# Patient Record
Sex: Male | Born: 1985 | Race: Black or African American | Hispanic: No | Marital: Single | Smoking: Former smoker
Health system: Southern US, Community
[De-identification: ages and names within clinical notes are randomized; demographics above are authoritative.]

## PROBLEM LIST (undated history)

## (undated) DIAGNOSIS — J45909 Unspecified asthma, uncomplicated: Secondary | ICD-10-CM

## (undated) DIAGNOSIS — S61219A Laceration without foreign body of unspecified finger without damage to nail, initial encounter: Secondary | ICD-10-CM

## (undated) DIAGNOSIS — K30 Functional dyspepsia: Secondary | ICD-10-CM

## (undated) DIAGNOSIS — Z87898 Personal history of other specified conditions: Secondary | ICD-10-CM

## (undated) HISTORY — PX: NO PAST SURGERIES: SHX2092

---
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
## 2014-06-20 ENCOUNTER — Emergency Department (HOSPITAL_COMMUNITY)
Admission: EM | Admit: 2014-06-20 | Discharge: 2014-06-21 | Disposition: A | Discharge status: Home / Self Care | Payer: Self-pay | Attending: Emergency Medicine | Admitting: Emergency Medicine

## 2014-06-20 ENCOUNTER — Encounter (HOSPITAL_COMMUNITY): Payer: Self-pay | Admitting: Emergency Medicine

## 2014-06-20 DIAGNOSIS — Y9389 Activity, other specified: Secondary | ICD-10-CM | POA: Insufficient documentation

## 2014-06-20 DIAGNOSIS — Y9289 Other specified places as the place of occurrence of the external cause: Secondary | ICD-10-CM | POA: Insufficient documentation

## 2014-06-20 DIAGNOSIS — Z880 Allergy status to penicillin: Secondary | ICD-10-CM | POA: Insufficient documentation

## 2014-06-20 DIAGNOSIS — S61209A Unspecified open wound of unspecified finger without damage to nail, initial encounter: Secondary | ICD-10-CM | POA: Insufficient documentation

## 2014-06-20 DIAGNOSIS — S61219A Laceration without foreign body of unspecified finger without damage to nail, initial encounter: Secondary | ICD-10-CM

## 2014-06-20 DIAGNOSIS — W268XXA Contact with other sharp object(s), not elsewhere classified, initial encounter: Secondary | ICD-10-CM | POA: Insufficient documentation

## 2014-06-20 DIAGNOSIS — F17200 Nicotine dependence, unspecified, uncomplicated: Secondary | ICD-10-CM | POA: Insufficient documentation

## 2014-06-20 DIAGNOSIS — S61210A Laceration without foreign body of right index finger without damage to nail, initial encounter: Secondary | ICD-10-CM

## 2014-06-20 DIAGNOSIS — J45909 Unspecified asthma, uncomplicated: Secondary | ICD-10-CM | POA: Insufficient documentation

## 2014-06-20 HISTORY — DX: Unspecified asthma, uncomplicated: J45.909

## 2014-06-20 HISTORY — DX: Laceration without foreign body of unspecified finger without damage to nail, initial encounter: S61.219A

## 2014-06-20 MED ORDER — LIDOCAINE HCL 2 % IJ SOLN: 10.0000 mL | Freq: Once | INTRAMUSCULAR | Status: DC

## 2014-06-20 NOTE — ED Notes (Signed)
Pt. presents with laceration at right mid index finger sustained this evening while working at a dry wall using a knife , minimal bleeding , dressing applied at triage .

## 2014-06-21 ENCOUNTER — Other Ambulatory Visit: Payer: Self-pay | Admitting: Orthopedic Surgery

## 2014-06-21 ENCOUNTER — Encounter (HOSPITAL_BASED_OUTPATIENT_CLINIC_OR_DEPARTMENT_OTHER): Payer: Self-pay | Admitting: *Deleted

## 2014-06-21 MED ORDER — HYDROCODONE-ACETAMINOPHEN 5-325 MG PO TABS
2.0000 | ORAL_TABLET | Freq: Once | ORAL | Status: AC
  Administered 2014-06-21: 2 via ORAL
  Filled 2014-06-21: qty 2

## 2014-06-21 MED ORDER — SULFAMETHOXAZOLE-TMP DS 800-160 MG PO TABS
1.0000 | ORAL_TABLET | Freq: Once | ORAL | Status: AC
  Administered 2014-06-21: 1 via ORAL
  Filled 2014-06-21: qty 1

## 2014-06-21 MED ORDER — HYDROCODONE-ACETAMINOPHEN 5-325 MG PO TABS: ORAL_TABLET | ORAL | Status: DC

## 2014-06-21 MED ORDER — SULFAMETHOXAZOLE-TRIMETHOPRIM 800-160 MG PO TABS: 1.0000 | ORAL_TABLET | Freq: Two times a day (BID) | ORAL | Status: DC

## 2014-06-21 NOTE — Discharge Instructions (Signed)
Rest, Ice intermittently (in the first 24-48 hours), Gentle compression with an Ace wrap, and elevate (Limb above the level of the heart)   Take up to  of ibuprofen (that is usually 4 over the counter pills)  3 times a day for 5 days. Take with food.  Take vicodin for breakthrough pain, do not drink alcohol, drive, care for children or do other critical tasks while taking vicodin.  Do not hesitate to return to the emergency room for any new, worsening or concerning symptoms.  Please obtain primary care using resource guide below. But the minute you were seen in the emergency room and that they will need to obtain records for further outpatient management.
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
## 2014-06-21 NOTE — ED Provider Notes (Signed)
CSN: [id number]     Arrival date & time 06/20/14  2318 History   First MD Initiated Contact with Patient 06/20/14 2335     Chief Complaint  Patient presents with  . Laceration     (Consider location/radiation/quality/duration/timing/severity/associated sxs/prior Treatment) HPI  Gilbert Morgan is a 28 y.o. male complaining of presenting for evaluation of laceration to right third digit sustained just prior to arrival. Patient states last tetanus was in 2011. Patient cut the right hand while using a box cutter while working on drywall in his house. Pain is severe and bleeding is not controlled. Patient states he has decreased sensation to the tip of the finger. Patient is right-hand-dominant.  Past Medical History  Diagnosis Date  . Asthma    History reviewed. No pertinent past surgical history. No family history on file. History  Substance Use Topics  . Smoking status: Current Every Day Smoker  . Smokeless tobacco: Not on file  . Alcohol Use: Yes    Review of Systems  10 systems reviewed and found to be negative, except as noted in the HPI.   Allergies  Penicillins  Home Medications   Prior to Admission medications   Not on File   BP 135/78  Pulse 58  Temp(Src) 97.8 F (36.6 C) (Oral)  Resp 14  Ht  (1.778 m)  Wt 203 lb (92.08 kg)  BMI 29.13 kg/m2  SpO2 94% Physical Exam  Nursing note and vitals reviewed. Constitutional: He is oriented to person, place, and time. He appears well-developed and well-nourished. No distress.  HENT:  Head: Normocephalic and atraumatic.  Mouth/Throat: Oropharynx is clear and moist.  Eyes: Conjunctivae and EOM are normal. Pupils are equal, round, and reactive to light.  Neck: Normal range of motion. Neck supple.  Cardiovascular: Normal rate, regular rhythm and intact distal pulses.   Pulmonary/Chest: Effort normal and breath sounds normal. No stridor. No respiratory distress. He has no wheezes. He has no rales. He exhibits no  tenderness.  Abdominal: Soft. Bowel sounds are normal. He exhibits no distension and no mass. There is no tenderness. There is no rebound and no guarding.  Genitourinary: Rectum normal.  Musculoskeletal: Normal range of motion. He exhibits no edema and no tenderness.       Hands: Full-thickness laceration approximately 2 cm to right second digit. Patient has decreased sensation and no flexor motion to the DIP or PIP.   Patient also has a partial-thickness laceration to the third digit at the PIP.   Neurological: He is alert and oriented to person, place, and time. No cranial nerve deficit. Coordination normal.  Psychiatric: He has a normal mood and affect.    ED Course  LACERATION REPAIR Date/Time: 06/21/2014 1:53 AM Performed by: Wynetta Emery Authorized by: Wynetta Emery Consent: Verbal consent obtained. Risks and benefits: risks, benefits and alternatives were discussed Consent given by: patient Required items: required blood products, implants, devices, and special equipment available Patient identity confirmed: verbally with patient Body area: upper extremity Location details: right index finger Laceration length: 2 cm Foreign bodies: no foreign bodies Tendon involvement: complex Nerve involvement: complex Vascular damage: no Anesthesia: digital block Local anesthetic: lidocaine 2% without epinephrine Anesthetic total: 5 ml Patient sedated: no Preparation: Patient was prepped and draped in the usual sterile fashion. Irrigation solution: saline Irrigation method: syringe Amount of cleaning: extensive Debridement: minimal Degree of undermining: none Skin closure: 4-0 Prolene Number of sutures: 6 Technique: running Approximation: loose Approximation difficulty: simple Dressing: antibiotic ointment and 4x4  sterile gauze Patient tolerance: Patient tolerated the procedure well with no immediate complications.  SPLINT APPLICATION Date/Time: 2:01 AM Authorized by:  Wynetta Emery Consent: Verbal consent obtained. Risks and benefits: risks, benefits and alternatives were discussed Consent given by: patient Splint applied by:  Mikyah Alamo, PA-C Location details: right second digit Splint type: static fingersplint Supplies used: Metal moldable finger splint Post-procedure: The splinted body part was neurovascularly unchanged following the procedure. Patient tolerance: Patient tolerated the procedure well with no immediate complications.    Labs Review Labs Reviewed - No data to display  Imaging Review No results found.   EKG Interpretation None      MDM   Final diagnoses:  Laceration of second finger, right, with tendon, initial encounter    Filed Vitals:   06/20/14 2330  BP: 135/78  Pulse: 58  Temp: 97.8 F (36.6 C)  TempSrc: Oral  Resp: 14  Height:  (1.778 m)  Weight: 203 lb (92.08 kg)  SpO2: 94%    Medications  lidocaine (XYLOCAINE) 2 % (with pres) injection 200 mg (not administered)  sulfamethoxazole-trimethoprim (BACTRIM DS) 800-160 MG per tablet 1 tablet (not administered)    Gilbert Morgan is a 28 y.o. male presenting with right hand second digit laceration with complete tendon transection. Wound is closed and patient is started on Bactrim (patient has anaphylactic reaction to penicillin) in consult from Dr. Donnelly Stager appreciated: He agrees with care plan and will see the patient in his office tomorrow. Note that patient has declined x-ray, advised him of the risks and he is exertional and stability.  This is a shared visit with the attending physician who personally evaluated the patient and agrees with the care plan.   Evaluation does not show pathology that would require ongoing emergent intervention or inpatient treatment. Pt is hemodynamically stable and mentating appropriately. Discussed findings and plan with patient/guardian, who agrees with care plan. All questions answered. Return precautions  discussed and outpatient follow up given.   New Prescriptions   HYDROCODONE-ACETAMINOPHEN (NORCO/VICODIN) 5-325 MG PER TABLET    Take 1-2 tablets by mouth every 6 hours as needed for pain.   SULFAMETHOXAZOLE-TRIMETHOPRIM (SEPTRA DS) 800-160 MG PER TABLET    Take 1 tablet by mouth every 12 (twelve) hours.         Wynetta Emery, PA-C 06/21/14 [phone]

## 2014-06-27 ENCOUNTER — Encounter (HOSPITAL_BASED_OUTPATIENT_CLINIC_OR_DEPARTMENT_OTHER): Payer: Self-pay | Admitting: Anesthesiology

## 2014-06-27 ENCOUNTER — Ambulatory Visit (HOSPITAL_BASED_OUTPATIENT_CLINIC_OR_DEPARTMENT_OTHER)
Admission: RE | Admit: 2014-06-27 | Discharge: 2014-06-27 | Disposition: A | Discharge status: Home / Self Care | Payer: Self-pay | Source: Ambulatory Visit | Attending: Orthopedic Surgery | Admitting: Orthopedic Surgery

## 2014-06-27 ENCOUNTER — Encounter (HOSPITAL_BASED_OUTPATIENT_CLINIC_OR_DEPARTMENT_OTHER)
Admission: RE | Disposition: A | Discharge status: Home / Self Care | Payer: Self-pay | Source: Ambulatory Visit | Attending: Orthopedic Surgery

## 2014-06-27 ENCOUNTER — Ambulatory Visit (HOSPITAL_BASED_OUTPATIENT_CLINIC_OR_DEPARTMENT_OTHER): Payer: Self-pay | Admitting: Anesthesiology

## 2014-06-27 DIAGNOSIS — S61209A Unspecified open wound of unspecified finger without damage to nail, initial encounter: Secondary | ICD-10-CM | POA: Insufficient documentation

## 2014-06-27 DIAGNOSIS — F17200 Nicotine dependence, unspecified, uncomplicated: Secondary | ICD-10-CM | POA: Insufficient documentation

## 2014-06-27 DIAGNOSIS — W269XXA Contact with unspecified sharp object(s), initial encounter: Secondary | ICD-10-CM | POA: Insufficient documentation

## 2014-06-27 DIAGNOSIS — Z880 Allergy status to penicillin: Secondary | ICD-10-CM | POA: Insufficient documentation

## 2014-06-27 DIAGNOSIS — S56129A Laceration of flexor muscle, fascia and tendon of unspecified finger at forearm level, initial encounter: Secondary | ICD-10-CM

## 2014-06-27 DIAGNOSIS — F1210 Cannabis abuse, uncomplicated: Secondary | ICD-10-CM | POA: Insufficient documentation

## 2014-06-27 DIAGNOSIS — J45909 Unspecified asthma, uncomplicated: Secondary | ICD-10-CM | POA: Insufficient documentation

## 2014-06-27 DIAGNOSIS — IMO0002 Reserved for concepts with insufficient information to code with codable children: Secondary | ICD-10-CM | POA: Insufficient documentation

## 2014-06-27 DIAGNOSIS — Y929 Unspecified place or not applicable: Secondary | ICD-10-CM | POA: Insufficient documentation

## 2014-06-27 HISTORY — DX: Functional dyspepsia: K30

## 2014-06-27 HISTORY — DX: Personal history of other specified conditions: Z87.898

## 2014-06-27 HISTORY — PX: NERVE, TENDON AND ARTERY REPAIR: SHX5695

## 2014-06-27 HISTORY — DX: Laceration without foreign body of unspecified finger without damage to nail, initial encounter: S61.219A

## 2014-06-27 LAB — POCT HEMOGLOBIN-HEMACUE: Hemoglobin: 10.9 g/dL — ABNORMAL LOW (ref 13.0–17.0)

## 2014-06-27 SURGERY — NERVE, TENDON AND ARTERY REPAIR
Anesthesia: General | Site: Finger | Laterality: Right

## 2014-06-27 MED ORDER — LACTATED RINGERS IV SOLN
INTRAVENOUS | Status: DC
  Administered 2014-06-27 (×2): via INTRAVENOUS

## 2014-06-27 MED ORDER — OXYCODONE-ACETAMINOPHEN 5-325 MG PO TABS: 1.0000 | ORAL_TABLET | ORAL | Status: DC | PRN

## 2014-06-27 MED ORDER — FENTANYL CITRATE 0.05 MG/ML IJ SOLN: 50.0000 ug | INTRAMUSCULAR | Status: DC | PRN

## 2014-06-27 MED ORDER — PROMETHAZINE HCL 25 MG/ML IJ SOLN: 6.2500 mg | INTRAMUSCULAR | Status: DC | PRN

## 2014-06-27 MED ORDER — DEXAMETHASONE SODIUM PHOSPHATE 10 MG/ML IJ SOLN
INTRAMUSCULAR | Status: DC | PRN
  Administered 2014-06-27: 10 mg via INTRAVENOUS

## 2014-06-27 MED ORDER — PROPOFOL 10 MG/ML IV BOLUS
INTRAVENOUS | Status: DC | PRN
  Administered 2014-06-27: 200 mg via INTRAVENOUS

## 2014-06-27 MED ORDER — BUPIVACAINE HCL (PF) 0.25 % IJ SOLN
INTRAMUSCULAR | Status: DC | PRN
  Administered 2014-06-27: 10 mL

## 2014-06-27 MED ORDER — HYDROMORPHONE HCL 1 MG/ML IJ SOLN
INTRAMUSCULAR | Status: AC
  Filled 2014-06-27: qty 1

## 2014-06-27 MED ORDER — VANCOMYCIN HCL IN DEXTROSE 1-5 GM/200ML-% IV SOLN
INTRAVENOUS | Status: AC
  Filled 2014-06-27: qty 200

## 2014-06-27 MED ORDER — BUPIVACAINE HCL (PF) 0.25 % IJ SOLN
INTRAMUSCULAR | Status: AC
  Filled 2014-06-27: qty 30

## 2014-06-27 MED ORDER — VANCOMYCIN HCL IN DEXTROSE 1-5 GM/200ML-% IV SOLN
1000.0000 mg | INTRAVENOUS | Status: AC
  Administered 2014-06-27: 1000 mg via INTRAVENOUS

## 2014-06-27 MED ORDER — HYDROMORPHONE HCL 1 MG/ML IJ SOLN
0.2500 mg | INTRAMUSCULAR | Status: DC | PRN
  Administered 2014-06-27 (×2): 0.5 mg via INTRAVENOUS

## 2014-06-27 MED ORDER — MIDAZOLAM HCL 2 MG/2ML IJ SOLN
INTRAMUSCULAR | Status: AC
  Filled 2014-06-27: qty 2

## 2014-06-27 MED ORDER — LIDOCAINE HCL (PF) 1 % IJ SOLN
INTRAMUSCULAR | Status: AC
  Filled 2014-06-27: qty 30

## 2014-06-27 MED ORDER — LIDOCAINE HCL (CARDIAC) 20 MG/ML IV SOLN
INTRAVENOUS | Status: DC | PRN
  Administered 2014-06-27: 50 mg via INTRAVENOUS

## 2014-06-27 MED ORDER — FENTANYL CITRATE 0.05 MG/ML IJ SOLN
INTRAMUSCULAR | Status: AC
  Filled 2014-06-27: qty 6

## 2014-06-27 MED ORDER — OXYCODONE HCL 5 MG PO TABS: 5.0000 mg | ORAL_TABLET | Freq: Once | ORAL | Status: DC | PRN

## 2014-06-27 MED ORDER — MIDAZOLAM HCL 5 MG/5ML IJ SOLN
INTRAMUSCULAR | Status: DC | PRN
  Administered 2014-06-27: 2 mg via INTRAVENOUS

## 2014-06-27 MED ORDER — HEPARIN SODIUM (PORCINE) 1000 UNIT/ML IJ SOLN
INTRAMUSCULAR | Status: AC
  Filled 2014-06-27: qty 1

## 2014-06-27 MED ORDER — CHLORHEXIDINE GLUCONATE 4 % EX LIQD: 60.0000 mL | Freq: Once | CUTANEOUS | Status: DC

## 2014-06-27 MED ORDER — FENTANYL CITRATE 0.05 MG/ML IJ SOLN
INTRAMUSCULAR | Status: DC | PRN
  Administered 2014-06-27: 100 ug via INTRAVENOUS
  Administered 2014-06-27 (×3): 50 ug via INTRAVENOUS

## 2014-06-27 MED ORDER — MIDAZOLAM HCL 2 MG/2ML IJ SOLN: 1.0000 mg | INTRAMUSCULAR | Status: DC | PRN

## 2014-06-27 MED ORDER — MIDAZOLAM HCL 2 MG/ML PO SYRP
12.0000 mg | ORAL_SOLUTION | Freq: Once | ORAL | Status: DC | PRN
Start: 2014-06-27 — End: 2014-06-27

## 2014-06-27 MED ORDER — ONDANSETRON HCL 4 MG/2ML IJ SOLN
INTRAMUSCULAR | Status: DC | PRN
  Administered 2014-06-27: 4 mg via INTRAVENOUS

## 2014-06-27 MED ORDER — OXYCODONE HCL 5 MG/5ML PO SOLN: 5.0000 mg | Freq: Once | ORAL | Status: DC | PRN

## 2014-06-27 SURGICAL SUPPLY — 67 items
APL SKNCLS STERI-STRIP NONHPOA (GAUZE/BANDAGES/DRESSINGS)
BAG DECANTER FOR FLEXI CONT (MISCELLANEOUS) IMPLANT
BANDAGE ELASTIC 4 VELCRO ST LF (GAUZE/BANDAGES/DRESSINGS) IMPLANT
BENZOIN TINCTURE PRP APPL 2/3 (GAUZE/BANDAGES/DRESSINGS) IMPLANT
BLADE MINI RND TIP GREEN BEAV (BLADE) IMPLANT
BLADE SURG 15 STRL LF DISP TIS (BLADE) ×1 IMPLANT
BLADE SURG 15 STRL SS (BLADE) ×3
BNDG CMPR 9X4 STRL LF SNTH (GAUZE/BANDAGES/DRESSINGS) ×1
BNDG CMPR MD 5X2 ELC HKLP STRL (GAUZE/BANDAGES/DRESSINGS) ×1
BNDG COHESIVE 1X5 TAN STRL LF (GAUZE/BANDAGES/DRESSINGS) IMPLANT
BNDG ELASTIC 2 VLCR STRL LF (GAUZE/BANDAGES/DRESSINGS) ×3 IMPLANT
BNDG ESMARK 4X9 LF (GAUZE/BANDAGES/DRESSINGS) ×2 IMPLANT
BNDG GAUZE ELAST 4 BULKY (GAUZE/BANDAGES/DRESSINGS) ×3 IMPLANT
CLOSURE WOUND 1/2 X4 (GAUZE/BANDAGES/DRESSINGS)
CORDS BIPOLAR (ELECTRODE) ×3 IMPLANT
COVER TABLE BACK 60X90 (DRAPES) ×3 IMPLANT
CUFF TOURNIQUET SINGLE 18IN (TOURNIQUET CUFF) ×3 IMPLANT
DECANTER SPIKE VIAL GLASS SM (MISCELLANEOUS) IMPLANT
DRAPE EXTREMITY TIBURON (DRAPES) ×3 IMPLANT
DRAPE SURG 17X23 STRL (DRAPES) ×3 IMPLANT
DURAPREP 26ML APPLICATOR (WOUND CARE) ×3 IMPLANT
GAUZE SPONGE 4X4 12PLY STRL (GAUZE/BANDAGES/DRESSINGS) ×3 IMPLANT
GAUZE SPONGE 4X4 16PLY XRAY LF (GAUZE/BANDAGES/DRESSINGS) IMPLANT
GAUZE XEROFORM 1X8 LF (GAUZE/BANDAGES/DRESSINGS) ×2 IMPLANT
GLOVE BIOGEL M STRL SZ7.5 (GLOVE) ×4 IMPLANT
GLOVE ECLIPSE 6.5 STRL STRAW (GLOVE) ×6 IMPLANT
GLOVE SURG SYN 8.0 (GLOVE) ×6 IMPLANT
GLOVE SURG SYN 8.0 PF PI (GLOVE) ×2 IMPLANT
GOWN STRL REUS W/ TWL LRG LVL3 (GOWN DISPOSABLE) ×1 IMPLANT
GOWN STRL REUS W/TWL LRG LVL3 (GOWN DISPOSABLE) ×3
GOWN STRL REUS W/TWL XL LVL3 (GOWN DISPOSABLE) ×6 IMPLANT
IV LACTATED RINGERS 500ML (IV SOLUTION) ×1 IMPLANT
NDL HYPO 25X1 1.5 SAFETY (NEEDLE) ×1 IMPLANT
NEEDLE HYPO 25X1 1.5 SAFETY (NEEDLE) ×3 IMPLANT
NS IRRIG 1000ML POUR BTL (IV SOLUTION) ×3 IMPLANT
PACK BASIN DAY SURGERY FS (CUSTOM PROCEDURE TRAY) ×3 IMPLANT
PAD CAST 3X4 CTTN HI CHSV (CAST SUPPLIES) ×1 IMPLANT
PADDING CAST ABS 4INX4YD NS (CAST SUPPLIES)
PADDING CAST ABS COTTON 4X4 ST (CAST SUPPLIES) ×1 IMPLANT
PADDING CAST COTTON 3X4 STRL (CAST SUPPLIES) ×3
PADDING UNDERCAST 2 STRL (CAST SUPPLIES)
PADDING UNDERCAST 2X4 STRL (CAST SUPPLIES) IMPLANT
PASSER SUT SWANSON 36MM LOOP (INSTRUMENTS) IMPLANT
SHEET MEDIUM DRAPE 40X70 STRL (DRAPES) ×3 IMPLANT
SPEAR EYE SURG WECK-CEL (MISCELLANEOUS) ×2 IMPLANT
SPLINT PLASTER CAST XFAST 4X15 (CAST SUPPLIES) ×15 IMPLANT
SPLINT PLASTER XTRA FAST SET 4 (CAST SUPPLIES) ×20
SPONGE GAUZE 4X4 12PLY STER LF (GAUZE/BANDAGES/DRESSINGS) ×2 IMPLANT
STOCKINETTE 4X48 STRL (DRAPES) ×3 IMPLANT
STRIP CLOSURE SKIN 1/2X4 (GAUZE/BANDAGES/DRESSINGS) IMPLANT
SUT ETHIBOND 3-0 V-5 (SUTURE) ×4 IMPLANT
SUT ETHILON 4 0 PS 2 18 (SUTURE) ×7 IMPLANT
SUT ETHILON 5 0 PS 2 18 (SUTURE) IMPLANT
SUT FIBERWIRE 3-0 18 TAPR NDL (SUTURE)
SUT FIBERWIRE 4-0 18 TAPR NDL (SUTURE) ×3
SUT NYLON 9 0 VRM6 (SUTURE) ×2 IMPLANT
SUT PROLENE 3 0 PS 2 (SUTURE) IMPLANT
SUT PROLENE 6 0 P 1 18 (SUTURE) ×2 IMPLANT
SUT VICRYL RAPIDE 4-0 (SUTURE) IMPLANT
SUT VICRYL RAPIDE 4/0 PS 2 (SUTURE) IMPLANT
SUTURE FIBERWR 3-0 18 TAPR NDL (SUTURE) IMPLANT
SUTURE FIBERWR 4-0 18 TAPR NDL (SUTURE) IMPLANT
SYR BULB 3OZ (MISCELLANEOUS) ×3 IMPLANT
SYRINGE 10CC LL (SYRINGE) ×2 IMPLANT
TOWEL OR 17X24 6PK STRL BLUE (TOWEL DISPOSABLE) ×3 IMPLANT
TUBE FEEDING 5FR 15 INCH (TUBING) IMPLANT
UNDERPAD 30X30 INCONTINENT (UNDERPADS AND DIAPERS) ×3 IMPLANT

## 2014-06-27 NOTE — Discharge Instructions (Addendum)
Call your surgeon if you experience:   1.  Fever over 101.0. 2.  Inability to urinate. 3.  Nausea and/or vomiting. 4.  Extreme swelling or bruising at the surgical site. 5.  Continued bleeding from the incision. 6.  Increased pain, redness or drainage from the incision. 7.  Problems related to your pain medication. 8. Any change in color, movement and/or sensation 9. Any problems and/or concerns   Post Anesthesia Home Care Instructions  Activity: Get plenty of rest for the remainder of the day. A responsible adult should stay with you for 24 hours following the procedure.  For the next 24 hours, DO NOT: -Drive a car -Advertising copywriter -Drink alcoholic beverages -Take any medication unless instructed by your physician -Make any legal decisions or sign important papers.  Meals: Start with liquid foods such as gelatin or soup. Progress to regular foods as tolerated. Avoid greasy, spicy, heavy foods. If nausea and/or vomiting occur, drink only clear liquids until the nausea and/or vomiting subsides. Call your physician if vomiting continues.  Special Instructions/Symptoms: Your throat may feel dry or sore from the anesthesia or the breathing tube placed in your throat during surgery. If this causes discomfort, gargle with warm salt water. The discomfort should disappear within 24 hours.   Post Anesthesia Home Care Instructions  Activity: Get plenty of rest for the remainder of the day. A responsible adult should stay with you for 24 hours following the procedure.  For the next 24 hours, DO NOT: -Drive a car -Advertising copywriter -Drink alcoholic beverages -Take any medication unless instructed by your physician -Make any legal decisions or sign important papers.  Meals: Start with liquid foods such as gelatin or soup. Progress to regular foods as tolerated. Avoid greasy, spicy, heavy foods. If nausea and/or vomiting occur, drink only clear liquids until the nausea and/or vomiting  subsides. Call your physician if vomiting continues.  Special Instructions/Symptoms: Your throat may feel dry or sore from the anesthesia or the breathing tube placed in your throat during surgery. If this causes discomfort, gargle with warm salt water. The discomfort should disappear within 24 hours.

## 2014-06-27 NOTE — Transfer of Care (Signed)
Immediate Anesthesia Transfer of Care Note  Patient: Gilbert Morgan  Procedure(s) Performed: Procedure(s): REPAIR AND EXPLORE AS NEEDED NERVE, TENDON AND ARTERY OF THE RIGHT INDEX AND LONG FINGER (Right)  Patient Location: PACU  Anesthesia Type:General  Level of Consciousness: awake  Airway & Oxygen Therapy: Patient Spontanous Breathing and Patient connected to face mask oxygen  Post-op Assessment: Report given to PACU RN and Post -op Vital signs reviewed and stable  Post vital signs: Reviewed and stable  Complications: No apparent anesthesia complications

## 2014-06-27 NOTE — H&P (Signed)
Gilbert Morgan is an 28 y.o. male.   Chief Complaint: right index and long volar lacerations HPI: as above with decreased flexion and sensation  Past Medical History  Diagnosis Date  . History of seizure age 70    x 1 - result of a head injury - was never on anticonvulsant med.  . Asthma     no inhaler use in > 1 yr.  . Indigestion     occasional - no current med.  . Laceration of finger of right hand with tendon involvement 06/20/2014    index and long fingers    Past Surgical History  Procedure Laterality Date  . No past surgeries      History reviewed. No pertinent family history. Social History:  reports that he has been smoking Cigarettes.  He has a 4 pack-year smoking history. He has never used smokeless tobacco. He reports that he drinks alcohol. He reports that he uses illicit drugs (Marijuana).  Allergies:  Allergies  Allergen Reactions  . Penicillins Shortness Of Breath    Medications Prior to Admission  Medication Sig Dispense Refill  . HYDROcodone-acetaminophen (NORCO/VICODIN) 5-325 MG per tablet Take 1-2 tablets by mouth every 6 hours as needed for pain.  15 tablet  0  . sulfamethoxazole-trimethoprim (SEPTRA DS) 800-160 MG per tablet Take 1 tablet by mouth every 12 (twelve) hours.  14 tablet  0    No results found for this or any previous visit (from the past 48 hour(s)). No results found.  Review of Systems  All other systems reviewed and are negative.   Blood pressure 120/71, pulse 48, temperature 97.8 F (36.6 C), temperature source Oral, resp. rate 20, height  (1.778 m), weight 92.171 kg (203 lb 3.2 oz), SpO2 97.00%. Physical Exam  Constitutional: He is oriented to person, place, and time. He appears well-developed and well-nourished.  HENT:  Head: Normocephalic and atraumatic.  Cardiovascular: Normal rate.   Respiratory: Effort normal.  Musculoskeletal:       Right hand: He exhibits decreased range of motion, disruption of two-point  discrimination and laceration.  Right index and long volar lacerations with decreased flexion and sensation  Neurological: He is alert and oriented to person, place, and time.  Skin: Skin is warm.  Psychiatric: He has a normal mood and affect. His behavior is normal. Judgment and thought content normal.     Assessment/Plan As above   Plan explore and repair as needed  Jessicah Croll A 06/27/2014, 7:19 AM

## 2014-06-27 NOTE — Anesthesia Procedure Notes (Signed)
Procedure Name: LMA Insertion Date/Time: 06/27/2014 7:34 AM Performed by: Caren Macadam Pre-anesthesia Checklist: Patient identified, Emergency Drugs available, Suction available and Patient being monitored Patient Re-evaluated:Patient Re-evaluated prior to inductionOxygen Delivery Method: Circle System Utilized Preoxygenation: Pre-oxygenation with 100% oxygen Intubation Type: IV induction Ventilation: Mask ventilation without difficulty LMA: LMA inserted LMA Size: 5.0 Number of attempts: 1 Airway Equipment and Method: bite block Placement Confirmation: positive ETCO2 and breath sounds checked- equal and bilateral Tube secured with: Tape Dental Injury: Teeth and Oropharynx as per pre-operative assessment

## 2014-06-27 NOTE — Anesthesia Postprocedure Evaluation (Signed)
  Anesthesia Post-op Note  Patient: Gilbert Morgan  Procedure(s) Performed: Procedure(s): REPAIR AND EXPLORE AS NEEDED NERVE, TENDON AND ARTERY OF THE RIGHT INDEX AND LONG FINGER (Right)  Patient Location: PACU  Anesthesia Type: General LMA   Level of Consciousness: awake, alert  and oriented  Airway and Oxygen Therapy: Patient Spontanous Breathing  Post-op Pain: mild  Post-op Assessment: Post-op Vital signs reviewed  Post-op Vital Signs: Reviewed  Last Vitals:  Filed Vitals:   06/27/14 1000  BP: 128/76  Pulse: 72  Temp:   Resp: 14    Complications: No apparent anesthesia complications

## 2014-06-27 NOTE — Op Note (Signed)
See note [id number]

## 2014-06-27 NOTE — Anesthesia Preprocedure Evaluation (Addendum)
Anesthesia Evaluation  Patient identified by MRN, date of birth, ID band Patient awake    Reviewed: Allergy & Precautions, H&P , NPO status , Patient's Chart, lab work & pertinent test results  Airway Mallampati: II TM Distance: >3 FB Neck ROM: Full    Dental  (+) Teeth Intact, Dental Advisory Given   Pulmonary asthma , Current Smoker,  breath sounds clear to auscultation        Cardiovascular negative cardio ROS      Neuro/Psych negative neurological ROS  negative psych ROS   GI/Hepatic negative GI ROS, Neg liver ROS,   Endo/Other  negative endocrine ROS  Renal/GU negative Renal ROS     Musculoskeletal   Abdominal   Peds  Hematology   Anesthesia Other Findings   Reproductive/Obstetrics negative OB ROS                          Anesthesia Physical Anesthesia Plan  ASA: II  Anesthesia Plan: General LMA   Post-op Pain Management:    Induction: Intravenous  Airway Management Planned: LMA  Additional Equipment:   Intra-op Plan:   Post-operative Plan: Extubation in OR  Informed Consent: I have reviewed the patients History and Physical, chart, labs and discussed the procedure including the risks, benefits and alternatives for the proposed anesthesia with the patient or authorized representative who has indicated his/her understanding and acceptance.   Dental advisory given  Plan Discussed with: Anesthesiologist, CRNA and Surgeon  Anesthesia Plan Comments:        Anesthesia Quick Evaluation

## 2014-06-28 ENCOUNTER — Encounter (HOSPITAL_BASED_OUTPATIENT_CLINIC_OR_DEPARTMENT_OTHER): Payer: Self-pay | Admitting: Orthopedic Surgery

## 2014-06-28 NOTE — Op Note (Signed)
NAMLynden Oxford:  Liou, Sender             ACCOUNT NO.:  [id number]  MEDICAL RECORD NO.:  [id number]  LOCATION:                                 FACILITY:  PHYSICIAN:  Artist PaisMatthew A. Azan Maneri, M.D.DATE OF BIRTH:  [birth year]  DATE OF PROCEDURE:  06/27/2014 DATE OF DISCHARGE:  06/27/2014                              OPERATIVE REPORT   PREOPERATIVE DIAGNOSIS:  Deep lacerations, palmar aspect, right index and long fingers.  POSTOPERATIVE DIAGNOSIS:  Deep lacerations, palmar aspect, right index and long fingers.  PROCEDURE:  Exploration, repair of right index finger zone 2, profundus and superficialis flexor tendons as well as microscopic repair of radial digital artery and digital nerve, index finger on the right and microscopic repair of right long finger radial digital artery.  SURGEON:  Artist PaisMatthew A. Mina MarbleWeingold, M.D.  ASSISTANT:  Jonni Sangerobert J. Dasnoit, PA  ANESTHESIA:  General.  COMPLICATION:  None.  DRAINS:  None.  DESCRIPTION OF PROCEDURE:  The patient was taken to the operating suite after induction of adequate level of general anesthesia, right upper extremity was prepped and draped in sterile fashion.  An Esmarch was used to exsanguinate the limb.  Tourniquet was inflated to 250 mmHg.  At this point in time, a laceration across the PIP flexion crease, index finger was extended distally midlateral and proximal mid lateral, dissection was carried down to the flexor sheath.  There was complete laceration of the profundus tendon between the A2 and A4 pulleys and 95% laceration of superficialis with small bit of the ulnar insertion on the left as well as complete laceration of the radial neurovascular bundle. We also explored the long finger.  There was a partial laceration of the long finger radial digital nerve.  Under loupe magnification, we did our primary repair of the profundus tendon using 3-0 double-armed Ethibond and Tajimia suture followed by 6-0 Prolene epitendinous stitch  between the A2 and A4 pulleys.  After this was completed, we repaired the superficialis insertion using 4-0 FiberWire with horizontal mattress sutures, one for each slip of the superficialis tendon.  After this was completed, we irrigated.  We then brought the microscope onto the field. Under microscopic magnification, we repaired the radial digital artery and radial digital nerve with 9-0 nylon.  After this was completed, we then moved to the long finger where under microscopic magnification, we repaired the partial laceration of the radial digital nerve with 9-0 nylon.  The wounds were thoroughly irrigated and loosely closed with 4-0 nylon.  Xeroform, 4x4s, fluffs, and a dorsal extension block splint was applied.  Patient tolerated all procedures well in concealed fashion.     Artist PaisMatthew A. Mina MarbleWeingold, M.D.     MAW/MEDQ  D:  06/27/2014  T:  06/28/2014  Job:  [id number]

## 2014-06-29 ENCOUNTER — Ambulatory Visit: Payer: Self-pay | Attending: Orthopedic Surgery | Admitting: Occupational Therapy

## 2014-06-29 DIAGNOSIS — IMO0001 Reserved for inherently not codable concepts without codable children: Secondary | ICD-10-CM | POA: Insufficient documentation

## 2014-06-29 DIAGNOSIS — M25549 Pain in joints of unspecified hand: Secondary | ICD-10-CM | POA: Insufficient documentation

## 2014-06-29 DIAGNOSIS — M25649 Stiffness of unspecified hand, not elsewhere classified: Secondary | ICD-10-CM | POA: Insufficient documentation

## 2014-07-05 ENCOUNTER — Encounter: Payer: Self-pay | Admitting: Occupational Therapy

## 2014-07-11 ENCOUNTER — Ambulatory Visit: Payer: Self-pay | Attending: Orthopedic Surgery | Admitting: Occupational Therapy

## 2014-07-11 DIAGNOSIS — Z9889 Other specified postprocedural states: Secondary | ICD-10-CM | POA: Insufficient documentation

## 2014-07-11 DIAGNOSIS — M79641 Pain in right hand: Secondary | ICD-10-CM | POA: Insufficient documentation

## 2014-07-11 DIAGNOSIS — M25641 Stiffness of right hand, not elsewhere classified: Secondary | ICD-10-CM | POA: Insufficient documentation

## 2014-07-14 ENCOUNTER — Ambulatory Visit: Payer: Self-pay | Admitting: Occupational Therapy

## 2014-07-17 ENCOUNTER — Emergency Department (HOSPITAL_COMMUNITY)
Admission: EM | Admit: 2014-07-17 | Discharge: 2014-07-17 | Disposition: A | Discharge status: Home / Self Care | Payer: No Typology Code available for payment source | Attending: Emergency Medicine | Admitting: Emergency Medicine

## 2014-07-17 ENCOUNTER — Encounter (HOSPITAL_COMMUNITY): Payer: Self-pay | Admitting: Emergency Medicine

## 2014-07-17 ENCOUNTER — Emergency Department (HOSPITAL_COMMUNITY): Payer: No Typology Code available for payment source

## 2014-07-17 DIAGNOSIS — Z792 Long term (current) use of antibiotics: Secondary | ICD-10-CM | POA: Insufficient documentation

## 2014-07-17 DIAGNOSIS — Z9889 Other specified postprocedural states: Secondary | ICD-10-CM | POA: Insufficient documentation

## 2014-07-17 DIAGNOSIS — S0990XA Unspecified injury of head, initial encounter: Secondary | ICD-10-CM | POA: Insufficient documentation

## 2014-07-17 DIAGNOSIS — Y92410 Unspecified street and highway as the place of occurrence of the external cause: Secondary | ICD-10-CM | POA: Diagnosis not present

## 2014-07-17 DIAGNOSIS — S199XXA Unspecified injury of neck, initial encounter: Secondary | ICD-10-CM | POA: Insufficient documentation

## 2014-07-17 DIAGNOSIS — J45909 Unspecified asthma, uncomplicated: Secondary | ICD-10-CM | POA: Insufficient documentation

## 2014-07-17 DIAGNOSIS — Z720 Tobacco use: Secondary | ICD-10-CM | POA: Insufficient documentation

## 2014-07-17 DIAGNOSIS — Y9389 Activity, other specified: Secondary | ICD-10-CM | POA: Insufficient documentation

## 2014-07-17 DIAGNOSIS — Z8669 Personal history of other diseases of the nervous system and sense organs: Secondary | ICD-10-CM | POA: Diagnosis not present

## 2014-07-17 DIAGNOSIS — Z880 Allergy status to penicillin: Secondary | ICD-10-CM | POA: Insufficient documentation

## 2014-07-17 MED ORDER — HYDROCODONE-ACETAMINOPHEN 5-325 MG PO TABS: 1.0000 | ORAL_TABLET | Freq: Four times a day (QID) | ORAL | Status: DC | PRN
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
## 2014-07-17 NOTE — Discharge Instructions (Signed)
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
## 2014-07-18 ENCOUNTER — Encounter: Payer: Self-pay | Admitting: Occupational Therapy

## 2014-07-21 ENCOUNTER — Encounter: Payer: Self-pay | Admitting: Occupational Therapy

## 2014-07-25 ENCOUNTER — Encounter: Payer: Self-pay | Admitting: Occupational Therapy

## 2014-07-28 ENCOUNTER — Encounter: Payer: Self-pay | Admitting: Occupational Therapy

## 2014-08-01 ENCOUNTER — Encounter: Payer: Self-pay | Admitting: Occupational Therapy

## 2014-08-04 ENCOUNTER — Encounter: Payer: Self-pay | Admitting: Occupational Therapy

## 2014-08-08 ENCOUNTER — Encounter: Payer: Self-pay | Admitting: Occupational Therapy

## 2014-08-08 ENCOUNTER — Telehealth: Payer: Self-pay | Admitting: Occupational Therapy

## 2014-08-08 ENCOUNTER — Ambulatory Visit: Payer: No Typology Code available for payment source | Admitting: *Deleted

## 2014-08-11 ENCOUNTER — Encounter: Payer: Self-pay | Admitting: Occupational Therapy

## 2014-08-15 ENCOUNTER — Encounter: Payer: Self-pay | Admitting: Occupational Therapy

## 2014-08-18 ENCOUNTER — Encounter: Payer: Self-pay | Admitting: Occupational Therapy

## 2014-08-22 ENCOUNTER — Encounter: Payer: Self-pay | Admitting: Occupational Therapy

## 2014-08-23 ENCOUNTER — Encounter: Payer: Self-pay | Admitting: Occupational Therapy

## 2014-08-23 ENCOUNTER — Ambulatory Visit: Payer: No Typology Code available for payment source | Attending: Orthopedic Surgery | Admitting: Occupational Therapy

## 2014-08-23 DIAGNOSIS — M25641 Stiffness of right hand, not elsewhere classified: Secondary | ICD-10-CM | POA: Insufficient documentation

## 2014-08-23 DIAGNOSIS — M79641 Pain in right hand: Secondary | ICD-10-CM | POA: Insufficient documentation

## 2014-08-23 DIAGNOSIS — Z9889 Other specified postprocedural states: Secondary | ICD-10-CM | POA: Insufficient documentation

## 2018-08-06 ENCOUNTER — Encounter (HOSPITAL_COMMUNITY): Payer: Self-pay | Admitting: Emergency Medicine

## 2018-08-06 ENCOUNTER — Other Ambulatory Visit: Payer: Self-pay

## 2018-08-06 ENCOUNTER — Ambulatory Visit (HOSPITAL_COMMUNITY)
Admission: EM | Admit: 2018-08-06 | Discharge: 2018-08-06 | Disposition: A | Discharge status: Home / Self Care | Payer: Commercial Managed Care - PPO | Attending: Internal Medicine | Admitting: Internal Medicine

## 2018-08-06 DIAGNOSIS — N509 Disorder of male genital organs, unspecified: Secondary | ICD-10-CM | POA: Diagnosis not present

## 2018-08-06 DIAGNOSIS — F17210 Nicotine dependence, cigarettes, uncomplicated: Secondary | ICD-10-CM | POA: Insufficient documentation

## 2018-08-06 DIAGNOSIS — R21 Rash and other nonspecific skin eruption: Secondary | ICD-10-CM | POA: Diagnosis present

## 2018-08-06 DIAGNOSIS — N489 Disorder of penis, unspecified: Secondary | ICD-10-CM

## 2018-08-06 MED ORDER — ACYCLOVIR 400 MG PO TABS: 400.0000 mg | ORAL_TABLET | Freq: Three times a day (TID) | ORAL | 0 refills | Status: AC

## 2018-08-06 MED ORDER — MUPIROCIN 2 % EX OINT: 1.0000 "application " | TOPICAL_OINTMENT | Freq: Two times a day (BID) | CUTANEOUS | 0 refills | Status: DC

## 2018-08-06 NOTE — Discharge Instructions (Signed)
Start acyclovir and Bactroban as directed.  You will be called for any positive results.  Refrain from sexual activity for the next 7 days, or until sores are healed.  Follow-up with PCP for further evaluation and management needed.

## 2018-08-06 NOTE — ED Provider Notes (Signed)
[facility]    CSN: [id number] Arrival date & time: 08/06/18  1459     History   Chief Complaint Chief Complaint  Patient presents with  . penile rash    HPI Gilbert Morgan is a 32 y.o. male.   32 year old male comes in for 1 week history of penile rash.  States there was a cluster of bumps to the base of his penis.  This started out as pus filled vesicles that has since erupted.  He went for STD testing, and had negative GC, HSV IgG, HIV, RPR.  Denies any itching.  Denies fever, chills, night sweats.  Surrounding erythema that has not been spreading.  He is sexually active with multiple male partners, no condom use.     Past Medical History:  Diagnosis Date  . Asthma    no inhaler use in > 1 yr.  . History of seizure age 38   x 1 - result of a head injury - was never on anticonvulsant med.  . Indigestion    occasional - no current med.  . Laceration of finger of right hand with tendon involvement 06/20/2014   index and long fingers    There are no active problems to display for this patient.   Past Surgical History:  Procedure Laterality Date  . NERVE, TENDON AND ARTERY REPAIR Right 06/27/2014   Procedure: REPAIR AND EXPLORE AS NEEDED NERVE, TENDON AND ARTERY OF THE RIGHT INDEX AND LONG FINGER;  Surgeon: Dairl Ponder, MD;  Location: [facility];  Service: Orthopedics;  Laterality: Right;  . NO PAST SURGERIES         Home Medications    Prior to Admission medications   Medication Sig Start Date End Date Taking? Authorizing Provider  acyclovir (ZOVIRAX) 400 MG tablet Take 1 tablet (400 mg total) by mouth 3 (three) times daily for 7 days. 08/06/18 08/13/18  Belinda Fisher, PA-C  mupirocin ointment (BACTROBAN) 2 % Apply 1 application topically 2 (two) times daily. 08/06/18   Belinda Fisher, PA-C    Family History History reviewed. No pertinent family history.  Social History Social History   Tobacco Use  . Smoking status: Current Every  Day Smoker    Packs/day: 0.50    Years: 8.00    Pack years: 4.00    Types: Cigarettes  . Smokeless tobacco: Never Used  Substance Use Topics  . Alcohol use: Yes    Comment: occasionally  . Drug use: Yes    Types: Marijuana    Comment: last used 06/14/2014     Allergies   Penicillins   Review of Systems Review of Systems  Reason unable to perform ROS: See HPI as above.     Physical Exam Triage Vital Signs ED Triage Vitals  Enc Vitals Group     BP 08/06/18 1514 138/70     Pulse Rate 08/06/18 1514 70     Resp --      Temp 08/06/18 1514 98.6 F (37 C)     Temp Source 08/06/18 1514 Oral     SpO2 08/06/18 1514 100 %     Weight --      Height --      Head Circumference --      Peak Flow --      Pain Score 08/06/18 1512 6     Pain Loc --      Pain Edu? --      Excl. in GC? --  No data found.  Updated Vital Signs BP 138/70 (BP Location: Left Arm)   Pulse 70   Temp 98.6 F (37 C) (Oral)   SpO2 100%   Physical Exam  Constitutional: He is oriented to person, place, and time. He appears well-developed and well-nourished. No distress.  HENT:  Head: Normocephalic and atraumatic.  Eyes: Pupils are equal, round, and reactive to light. Conjunctivae are normal.  Genitourinary:  Genitourinary Comments: Open sores to the base of the penis to the right. Surrounding erythema. Tenderness to palpation.   Chaperone present.  Neurological: He is alert and oriented to person, place, and time.  Skin: He is not diaphoretic.     UC Treatments / Results  Labs (all labs ordered are listed, but only abnormal results are displayed) Labs Reviewed  HSV CULTURE AND TYPING    EKG None  Radiology No results found.  Procedures Procedures (including critical care time)  Medications Ordered in UC Medications - No data to display  Initial Impression / Assessment and Plan / UC Course  I have reviewed the triage vital signs and the nursing notes.  Pertinent labs & imaging  results that were available during my care of the patient were reviewed by me and considered in my medical decision making (see chart for details).    Appearance consistent with HSV. Patient without HSV outbreak in the past. Will swab for culture. Acyclovir as directed. Discussed possible folliculitis. bactroban as directed. Refrain from sexual activity. Return precautions given.  Final Clinical Impressions(s) / UC Diagnoses   Final diagnoses:  Penile lesion    ED Prescriptions    Medication Sig Dispense Auth. Provider   mupirocin ointment (BACTROBAN) 2 % Apply 1 application topically 2 (two) times daily. 22 g Kearney Evitt V, PA-C   acyclovir (ZOVIRAX) 400 MG tablet Take 1 tablet (400 mg total) by mouth 3 (three) times daily for 7 days. 21 tablet Threasa Alpha, [place] 08/06/18 1541

## 2018-08-06 NOTE — ED Triage Notes (Signed)
Pt states he has a cluster of bumps on his penis.  He was tested for several STD's two days ago and they were all negative.  Pt states the penis was not examined at that time.

## 2018-08-09 LAB — HSV CULTURE AND TYPING

## 2018-08-10 ENCOUNTER — Telehealth (HOSPITAL_COMMUNITY): Payer: Self-pay | Admitting: Emergency Medicine

## 2018-08-10 NOTE — Telephone Encounter (Signed)
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 Pt had symptoms and was treated with acyclovir at Urgent care visit. Attempted to reach patient. No answer at this time. No Voicemail set up.

## 2018-08-11 ENCOUNTER — Telehealth (HOSPITAL_COMMUNITY): Payer: Self-pay

## 2018-08-11 NOTE — Telephone Encounter (Signed)
Pt called back and was given results; pt encouraged to follow up with PCP

## 2018-08-13 ENCOUNTER — Ambulatory Visit: Payer: Commercial Managed Care - PPO | Admitting: Family Medicine

## 2018-08-18 ENCOUNTER — Ambulatory Visit: Payer: Commercial Managed Care - PPO | Admitting: Family Medicine

## 2018-08-18 ENCOUNTER — Encounter

## 2018-08-26 ENCOUNTER — Telehealth (HOSPITAL_COMMUNITY): Payer: Self-pay | Admitting: Emergency Medicine

## 2018-08-26 MED ORDER — ACYCLOVIR 400 MG PO TABS: 400.0000 mg | ORAL_TABLET | Freq: Three times a day (TID) | ORAL | 0 refills | Status: DC

## 2018-08-26 NOTE — Telephone Encounter (Signed)
Pt called stating his rash hasn't fully cleared up, per Amy PA, to send 7 more days of acyclovir and to follow up with PCP if that does not resolve the rash. Pt agreeable to plan.

## 2018-09-04 ENCOUNTER — Ambulatory Visit (INDEPENDENT_AMBULATORY_CARE_PROVIDER_SITE_OTHER): Payer: Commercial Managed Care - PPO | Admitting: Family Medicine

## 2018-09-04 ENCOUNTER — Encounter: Payer: Self-pay | Admitting: Family Medicine

## 2018-09-04 VITALS — BP 113/81 | HR 59 | Resp 17 | Ht 71.0 in | Wt 203.2 lb

## 2018-09-04 DIAGNOSIS — Z716 Tobacco abuse counseling: Secondary | ICD-10-CM

## 2018-09-04 DIAGNOSIS — Z113 Encounter for screening for infections with a predominantly sexual mode of transmission: Secondary | ICD-10-CM

## 2018-09-04 DIAGNOSIS — B009 Herpesviral infection, unspecified: Secondary | ICD-10-CM | POA: Diagnosis not present

## 2018-09-04 DIAGNOSIS — Z131 Encounter for screening for diabetes mellitus: Secondary | ICD-10-CM | POA: Diagnosis not present

## 2018-09-04 DIAGNOSIS — Z1329 Encounter for screening for other suspected endocrine disorder: Secondary | ICD-10-CM | POA: Diagnosis not present

## 2018-09-04 LAB — POCT URINALYSIS DIP (CLINITEK)
BILIRUBIN UA: NEGATIVE
Glucose, UA: NEGATIVE mg/dL
Leukocytes, UA: NEGATIVE
Nitrite, UA: NEGATIVE
POC PROTEIN,UA: NEGATIVE
RBC UA: NEGATIVE
SPEC GRAV UA: 1.02 (ref 1.010–1.025)
Urobilinogen, UA: 1 E.U./dL
pH, UA: 6 (ref 5.0–8.0)

## 2018-09-04 MED ORDER — VARENICLINE TARTRATE 0.5 MG X 11 & 1 MG X 42 PO MISC: ORAL | 0 refills | Status: DC

## 2018-09-04 MED ORDER — VARENICLINE TARTRATE 1 MG PO TABS: 1.0000 mg | ORAL_TABLET | Freq: Two times a day (BID) | ORAL | 1 refills | Status: DC

## 2018-09-04 MED ORDER — ACYCLOVIR 400 MG PO TABS: 400.0000 mg | ORAL_TABLET | Freq: Three times a day (TID) | ORAL | 2 refills | Status: DC

## 2018-09-04 NOTE — Progress Notes (Signed)
Gilbert Morgan, is a 32 y.o. male  ZOX:[id number]  [id number]  DOB - Nov 20, 1985  CC: No chief complaint on file.      HPI: Gilbert Morgan is a 32 y.o. male is here today to establish Morgan.   Gilbert Morgan any worse of course go to the emergency department   Today's visit:  Gilbert Morgan.  He has had no previous PCP.  He presented to urgent [facility] on 11 7 with a concern for penile rash.  A culture was taken of the lesion which resulted to be HSV-2.  Patient has completed one cycle of acyclovir and reports resolution of the lesion and denies some burning, pain, or itching.  He reports he has had STD testing in the past which have all been negative.  He would like routine health maintenance screenings performed today.  He reports a family history significant for diabetes of both parents and other relatives.  He is a current daily smoker and wishes to quit.  He requests to start Chantix.  He has been successful with cessation in the past with patches however upon removal of the patches he resumed smoking.  He reports some mild anxiety.  He feels this is contributed to his extended work schedule.  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  He is currently not in relationship and also feels some anxiety associated with loneliness.  He has no history of major depressive disorder or generalized anxiety disorder  He denies any additional concerns today.  Current medications: Current Outpatient Medications:  .  acyclovir (ZOVIRAX) 400 MG tablet, Take 1 tablet (400 mg total) by mouth 3 (three) times daily., Disp: 21 tablet, Rfl: 0 .  mupirocin ointment (BACTROBAN) 2 %, Apply 1 application topically 2 (two) times daily., Disp: 22 g, Rfl: 0   Pertinent family medical history: family history is not on file.   Allergies  Allergen Reactions  . Penicillins Shortness Of Breath    Social History   Socioeconomic History  . Marital status: Single    Spouse  name: Not on file  . Number of children: Not on file  . Years of education: Not on file  . Highest education level: Not on file  Occupational History  . Not on file  Social Needs  . Financial resource strain: Not on file  . Food insecurity:    Worry: Not on file    Inability: Not on file  . Transportation needs:    Medical: Not on file    Non-medical: Not on file  Tobacco Use  . Smoking status: Current Every Day Smoker    Packs/day: 0.50    Years: 8.00    Pack years: 4.00    Types: Cigarettes  . Smokeless tobacco: Never Used  Substance and Sexual Activity  . Alcohol use: Yes    Comment: occasionally  . Drug use: Yes    Types: Marijuana    Comment: last used 06/14/2014  . Sexual activity: Not on file  Lifestyle  . Physical activity:    Days per week: Not on file    Minutes per session: Not on file  . Stress: Not on file  Relationships  . Social connections:    Talks on phone: Not on file    Gets together: Not on file    Attends religious service: Not on file    Active member of club or organization: Not on file    Attends meetings  of clubs or organizations: Not on file    Relationship status: Not on file  . Intimate partner violence:    Fear of current or ex partner: Not on file    Emotionally abused: Not on file    Physically abused: Not on file    Forced sexual activity: Not on file  Other Topics Concern  . Not on file  Social History Narrative  . Not on file    Review of Systems: Constitutional: Negative for fever, chills, diaphoresis, activity change, appetite change and fatigue. HENT: Negative for ear pain, nosebleeds, congestion, facial swelling, rhinorrhea, neck pain, neck stiffness and ear discharge.  Eyes: Negative for pain, discharge, redness, itching and visual disturbance. Respiratory: Negative for cough, choking, chest tightness, shortness of breath, wheezing and stridor.  Cardiovascular: Negative for chest pain, palpitations and leg  swelling. Gastrointestinal: Negative for abdominal distention or tenderness  Genitourinary: Negative for dysuria, urgency, frequency, hematuria, flank pain, decreased urine volume, difficulty urinating. Musculoskeletal: Negative for back pain, joint swelling, arthralgia and gait problem. Neurological: Negative for dizziness, tremors, seizures, syncope, facial asymmetry, speech difficulty, weakness, light-headedness, numbness and headaches.  Hematological: Negative for adenopathy. Does not bruise/bleed easily. Psychiatric/Behavioral: anxiousness   Objective:  There were no vitals filed for this visit.  BP Readings from Last 3 Encounters:  08/06/18 138/70  07/17/14 129/74  06/27/14 (!) 148/88    There were no vitals filed for this visit.    Physical Exam: Constitutional: Patient appears well-developed and well-nourished. No distress. HENT: Normocephalic, atraumatic, External right and left ear normal. Oropharynx is clear and moist.  Eyes: Conjunctivae and EOM are normal. PERRLA, no scleral icterus. Neck: Normal ROM. Neck supple. No JVD. No tracheal deviation. No thyromegaly. CVS: RRR, S1/S2 +, no murmurs, no gallops, no carotid bruit.  Pulmonary: Effort and breath sounds normal, no stridor, rhonchi, wheezes, rales.  Abdominal: Soft. BS +, no distension, tenderness, rebound or guarding.  Musculoskeletal: Normal range of motion. No edema and no tenderness.  Neuro: Alert. Normal muscle tone coordination. Normal gait. BUE and BLE strength 5/5. Bilateral hand grips symmetrical. Skin: Skin is warm and dry. No rash noted. Not diaphoretic. No erythema. No pallor. Psychiatric: Normal mood and affect. Behavior, judgment, thought content normal.  Lab Results (prior encounters)  Lab Results  Component Value Date   HGB 10.9 (L) 06/27/2014      Assessment and plan:  1. 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 -We will check a complete STD panel today. Encourage the use of barrier protection with each sexual encounter. - HIV antibody (with reflex) - RPR - GC/Chlamydia Probe Amp([facility]) - CBC with Differential - POCT URINALYSIS DIP ([facility])  3. Screening for diabetes mellitus - Comprehensive metabolic panel - Hemoglobin A1c  4. Screening for thyroid disorder - Thyroid Panel With TSH  5. Encounter for smoking cessation counseling -Start Chantix starter pack, then continue with Chantix 1 mg daily. Goal of cessation is 12 weeks or less. Return in about 8 weeks (around 10/30/2018) for smoking cessation .   The patient was given clear instructions to go to ER or return to medical center if symptoms don't improve, worsen or new problems develop. The patient verbalized understanding. The patient was advised  to call and obtain lab results if they haven't heard anything from out office within 7-10 business days.  Joaquin Courts, FNP [facility]  [address], [place] [address] [phone]: [phone]    This note has been created with Dragon speech recognition software and Paediatric nurse. Any transcriptional errors are unintentional.

## 2018-09-04 NOTE — Patient Instructions (Signed)
s Steps to Quit Smoking Smoking tobacco can be bad for your health. It can also affect almost every organ in your body. Smoking puts you and people around you at risk for many serious long-lasting (chronic) diseases. Quitting smoking is hard, but it is one of the best things that you can do for your health. It is never too late to quit. What are the benefits of quitting smoking? When you quit smoking, you lower your risk for getting serious diseases and conditions. They can include:  Lung cancer or lung disease.  Heart disease.  Stroke.  Heart attack.  Not being able to have children (infertility).  Weak bones (osteoporosis) and broken bones (fractures).  If you have coughing, wheezing, and shortness of breath, those symptoms may get better when you quit. You may also get sick less often. If you are pregnant, quitting smoking can help to lower your chances of having a baby of low birth weight. What can I do to help me quit smoking? Talk with your doctor about what can help you quit smoking. Some things you can do (strategies) include:  Quitting smoking totally, instead of slowly cutting back how much you smoke over a period of time.  Going to in-person counseling. You are more likely to quit if you go to many counseling sessions.  Using resources and support systems, such as: ? Online chats with a counselor. ? Phone quitlines. ? Printed self-help materials. ? Support groups or group counseling. ? Text messaging programs. ? Mobile phone apps or applications.  Taking medicines. Some of these medicines may have nicotine in them. If you are pregnant or breastfeeding, do not take any medicines to quit smoking unless your doctor says it is okay. Talk with your doctor about counseling or other things that can help you.  Talk with your doctor about using more than one strategy at the same time, such as taking medicines while you are also going to in-person counseling. This can help make  quitting easier. What things can I do to make it easier to quit? Quitting smoking might feel very hard at first, but there is a lot that you can do to make it easier. Take these steps:  Talk to your family and friends. Ask them to support and encourage you.  Call phone quitlines, reach out to support groups, or work with a counselor.  Ask people who smoke to not smoke around you.  Avoid places that make you want (trigger) to smoke, such as: ? Bars. ? Parties. ? Smoke-break areas at work.  Spend time with people who do not smoke.  Lower the stress in your life. Stress can make you want to smoke. Try these things to help your stress: ? Getting regular exercise. ? Deep-breathing exercises. ? Yoga. ? Meditating. ? Doing a body scan. To do this, close your eyes, focus on one area of your body at a time from head to toe, and notice which parts of your body are tense. Try to relax the muscles in those areas.  Download or buy apps on your mobile phone or tablet that can help you stick to your quit plan. There are many free apps, such as QuitGuide from the CDC (Centers for Disease Control and Prevention). You can find more support from smokefree.gov and other websites.  This information is not intended to replace advice given to you by your health care provider. Make sure you discuss any questions you have with your health care provider. Document Released: 07/13/2009   Document Revised: 05/14/2016 Document Reviewed: 01/31/2015 Elsevier Interactive Patient Education  2018 Elsevier Inc.  

## 2018-09-05 LAB — COMPREHENSIVE METABOLIC PANEL
ALT: 45 IU/L — ABNORMAL HIGH (ref 0–44)
AST: 92 IU/L — AB (ref 0–40)
Albumin/Globulin Ratio: 1.7 (ref 1.2–2.2)
Albumin: 4.6 g/dL (ref 3.5–5.5)
Alkaline Phosphatase: 86 IU/L (ref 39–117)
BILIRUBIN TOTAL: 0.7 mg/dL (ref 0.0–1.2)
BUN/Creatinine Ratio: 6 — ABNORMAL LOW (ref 9–20)
BUN: 9 mg/dL (ref 6–20)
CHLORIDE: 100 mmol/L (ref 96–106)
CO2: 25 mmol/L (ref 20–29)
CREATININE: 1.41 mg/dL — AB (ref 0.76–1.27)
Calcium: 9.7 mg/dL (ref 8.7–10.2)
GFR calc Af Amer: 76 mL/min/{1.73_m2} (ref >59)
GFR calc non Af Amer: 66 mL/min/{1.73_m2} (ref >59)
Globulin, Total: 2.7 g/dL (ref 1.5–4.5)
Glucose: 86 mg/dL (ref 65–99)
Potassium: 4.6 mmol/L (ref 3.5–5.2)
Sodium: 138 mmol/L (ref 134–144)
Total Protein: 7.3 g/dL (ref 6.0–8.5)

## 2018-09-05 LAB — CBC WITH DIFFERENTIAL/PLATELET
Basophils Absolute: 0 10*3/uL (ref 0.0–0.2)
Basos: 1 %
EOS (ABSOLUTE): 0.1 10*3/uL (ref 0.0–0.4)
Eos: 2 %
HEMATOCRIT: 48.2 % (ref 37.5–51.0)
Hemoglobin: 16.9 g/dL (ref 13.0–17.7)
IMMATURE GRANS (ABS): 0 10*3/uL (ref 0.0–0.1)
Immature Granulocytes: 0 %
Lymphocytes Absolute: 1.2 10*3/uL (ref 0.7–3.1)
Lymphs: 28 %
MCH: 33 pg (ref 26.6–33.0)
MCHC: 35.1 g/dL (ref 31.5–35.7)
MCV: 94 fL (ref 79–97)
Monocytes Absolute: 0.4 10*3/uL (ref 0.1–0.9)
Monocytes: 9 %
Neutrophils Absolute: 2.6 10*3/uL (ref 1.4–7.0)
Neutrophils: 60 %
Platelets: 202 10*3/uL (ref 150–450)
RBC: 5.12 x10E6/uL (ref 4.14–5.80)
RDW: 13.3 % (ref 12.3–15.4)
WBC: 4.3 10*3/uL (ref 3.4–10.8)

## 2018-09-05 LAB — THYROID PANEL WITH TSH
FREE THYROXINE INDEX: 1.2 (ref 1.2–4.9)
T3 UPTAKE RATIO: 32 % (ref 24–39)
T4 TOTAL: 3.8 ug/dL — AB (ref 4.5–12.0)
TSH: 0.716 u[IU]/mL (ref 0.450–4.500)

## 2018-09-05 LAB — HEMOGLOBIN A1C
ESTIMATED AVERAGE GLUCOSE: 108 mg/dL
Hgb A1c MFr Bld: 5.4 % (ref 4.8–5.6)

## 2018-09-05 LAB — RPR: RPR Ser Ql: NONREACTIVE

## 2018-09-05 LAB — HIV ANTIBODY (ROUTINE TESTING W REFLEX): HIV Screen 4th Generation wRfx: NONREACTIVE

## 2018-09-06 LAB — GC/CHLAMYDIA PROBE AMP
CHLAMYDIA, DNA PROBE: NEGATIVE
NEISSERIA GONORRHOEAE BY PCR: NEGATIVE

## 2018-09-07 ENCOUNTER — Telehealth: Payer: Self-pay | Admitting: Family Medicine

## 2018-09-07 DIAGNOSIS — R748 Abnormal levels of other serum enzymes: Secondary | ICD-10-CM

## 2018-09-07 DIAGNOSIS — N289 Disorder of kidney and ureter, unspecified: Secondary | ICD-10-CM

## 2018-09-07 NOTE — Telephone Encounter (Signed)
Contact patient to advise all labs were within normal range with the exception his liver enzymes are elevated.  Inquire as to whether or not patient is drinking excessive amounts of alcohol.  If so I would like for him to significantly reduce his intake of alcohol.  I would like to repeat these liver enzyme studies in 4 weeks.  His renal function is also very mildly elevated this could be due to dehydration and not enough intake of water.  However I will also repeat that level in 4 weeks.

## 2018-09-08 NOTE — Telephone Encounter (Signed)
Patient notified of lab results & recommendations. Expressed understanding. States that he did fall in a rut & was drinking a lot of alcohol after his recent diagnosis. States that he will cut back & will have labs rechecked at his appointment in January.

## 2018-09-25 ENCOUNTER — Ambulatory Visit: Payer: Commercial Managed Care - PPO | Admitting: Family Medicine

## 2018-10-15 ENCOUNTER — Ambulatory Visit (INDEPENDENT_AMBULATORY_CARE_PROVIDER_SITE_OTHER): Payer: Self-pay | Admitting: Family Medicine

## 2018-10-15 ENCOUNTER — Ambulatory Visit (INDEPENDENT_AMBULATORY_CARE_PROVIDER_SITE_OTHER): Payer: BLUE CROSS/BLUE SHIELD

## 2018-10-15 ENCOUNTER — Encounter: Payer: Self-pay | Admitting: Family Medicine

## 2018-10-15 VITALS — BP 115/76 | HR 68 | Temp 98.0°F | Resp 19 | Ht 71.0 in | Wt 215.6 lb

## 2018-10-15 DIAGNOSIS — R0602 Shortness of breath: Secondary | ICD-10-CM

## 2018-10-15 DIAGNOSIS — J209 Acute bronchitis, unspecified: Secondary | ICD-10-CM

## 2018-10-15 DIAGNOSIS — Z716 Tobacco abuse counseling: Secondary | ICD-10-CM

## 2018-10-15 DIAGNOSIS — N289 Disorder of kidney and ureter, unspecified: Secondary | ICD-10-CM

## 2018-10-15 DIAGNOSIS — R062 Wheezing: Secondary | ICD-10-CM

## 2018-10-15 DIAGNOSIS — R0789 Other chest pain: Secondary | ICD-10-CM | POA: Diagnosis not present

## 2018-10-15 DIAGNOSIS — R748 Abnormal levels of other serum enzymes: Secondary | ICD-10-CM

## 2018-10-15 MED ORDER — BENZONATATE 100 MG PO CAPS: 100.0000 mg | ORAL_CAPSULE | Freq: Three times a day (TID) | ORAL | 0 refills | Status: DC | PRN

## 2018-10-15 MED ORDER — ALBUTEROL SULFATE (2.5 MG/3ML) 0.083% IN NEBU: 3.0000 mL | INHALATION_SOLUTION | RESPIRATORY_TRACT | 3 refills | Status: AC | PRN

## 2018-10-15 MED ORDER — PREDNISONE 20 MG PO TABS: 40.0000 mg | ORAL_TABLET | Freq: Every day | ORAL | 0 refills | Status: AC

## 2018-10-15 MED ORDER — AZITHROMYCIN 250 MG PO TABS: ORAL_TABLET | ORAL | 0 refills | Status: DC

## 2018-10-15 MED ORDER — MONTELUKAST SODIUM 10 MG PO TABS: 10.0000 mg | ORAL_TABLET | Freq: Every day | ORAL | 3 refills | Status: AC

## 2018-10-15 NOTE — Progress Notes (Signed)
Established Patient Office Visit  Subjective:  Patient ID: Gilbert Morgan, male    DOB: [birth year]  Age: 33 y.o. MRN: [id number]  CC:  Chief Complaint  Patient presents with  . Nicotine Dependence    patient has stopped smoking. states that he's been sick for the last month & quit  . Bronchitis    recently treated with prednisone for bronchitis. states that he's still having SHOB, wheezing & chest tightness    HPI Gilbert Morgan presents initially for smoking cessation follow-up, however needs a follow-up on recently diagnosed acute bronchitis and asthma exacerbation.  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  Patient reports that he was seen and treated at an urgent care with an acute bronchitis diagnosis and acute asthma exacerbation.  He reports he has been treated with 2 rounds of prednisone and an antibiotic and continues to have a cough and wheezing.  He has a albuterol inhaler although he reports is almost empty that he has been using consistently every 4 hours.  He reports that he also has a nebulizer machine which he uses every 6 hours as needed for shortness of breath.  He has not had a fever although endorses body aches and fatigue.  Since being ill for over the last month he has not smoked any cigarettes.  He also discontinued Chantix as he soon became ill after medication was prescribed.  At present he denies cravings for cigarettes and has not smoked in almost a month.  During his last office visit blood work was completed and was significant for an elevated creatinine level along with elevated LFTs.  Patient endorses today that he has been a heavy drinker of alcohol and has made efforts to cut back once he was notified of his recent labs.  We will repeat liver enzymes today. Past Medical History:  Diagnosis Date  . Asthma    no inhaler use in > 1 yr.  . History of seizure age 61   x 1 - result of a head injury - was never on anticonvulsant med.   . Indigestion    occasional - no current med.  . Laceration of finger of right hand with tendon involvement 06/20/2014   index and long fingers    Past Surgical History:  Procedure Laterality Date  . NERVE, TENDON AND ARTERY REPAIR Right 06/27/2014   Procedure: REPAIR AND EXPLORE AS NEEDED NERVE, TENDON AND ARTERY OF THE RIGHT INDEX AND LONG FINGER;  Surgeon: Dairl PonderMatthew Weingold, MD;  Location: [facility];  Service: Orthopedics;  Laterality: Right;  . NO PAST SURGERIES      No family history on file.  Social History   Socioeconomic History  . Marital status: Single    Spouse name: Not on file  . Number of children: Not on file  . Years of education: Not on file  . Highest education level: Not on file  Occupational History  . Not on file  Social Needs  . Financial resource strain: Not on file  . Food insecurity:    Worry: Not on file    Inability: Not on file  . Transportation needs:    Medical: Not on file    Non-medical: Not on file  Tobacco Use  . Smoking status: Former Smoker    Packs/day: 0.50    Years: 8.00    Pack years: 4.00    Types: Cigarettes  . Smokeless tobacco: Never Used  Substance and  Sexual Activity  . Alcohol use: Yes    Comment: occasionally  . Drug use: Yes    Types: Marijuana    Comment: last used 06/14/2014  . Sexual activity: Not Currently    Partners: Female  Lifestyle  . Physical activity:    Days per week: Not on file    Minutes per session: Not on file  . Stress: Not on file  Relationships  . Social connections:    Talks on phone: Not on file    Gets together: Not on file    Attends religious service: Not on file    Active member of club or organization: Not on file    Attends meetings of clubs or organizations: Not on file    Relationship status: Not on file  . Intimate partner violence:    Fear of current or ex partner: Not on file    Emotionally abused: Not on file    Physically abused: Not on file    Forced  sexual activity: Not on file  Other Topics Concern  . Not on file  Social History Narrative  . Not on file    Outpatient Medications Prior to Visit  Medication Sig Dispense Refill  . acyclovir (ZOVIRAX) 400 MG tablet Take 1 tablet (400 mg total) by mouth 3 (three) times daily. 21 tablet 2  . albuterol (PROVENTIL HFA;VENTOLIN HFA) 108 (90 Base) MCG/ACT inhaler Inhale 2 puffs into the lungs every 6 (six) hours as needed for wheezing or shortness of breath.    Marland Kitchen albuterol (PROVENTIL) (2.5 MG/3ML) 0.083% nebulizer solution Inhale 3 mLs into the lungs every 4 (four) hours as needed for wheezing or shortness of breath.    . varenicline (CHANTIX STARTING MONTH PAK) 0.5 MG X 11 & 1 MG X 42 tablet Take one 0.5 mg tablet by mouth once daily for 3 days, then increase to one 0.5 mg tablet twice daily for 4 days, then increase to one 1 mg tablet twice daily. 53 tablet 0  . varenicline (CHANTIX) 1 MG tablet Take 1 tablet (1 mg total) by mouth 2 (two) times daily. 30 tablet 1   No facility-administered medications prior to visit.     Allergies  Allergen Reactions  . Penicillins Shortness Of Breath    ROS Review of Systems Pertinent negatives listed in HPI   Objective:    Physical Exam  BP 115/76   Pulse 68   Temp 98 F (36.7 C) (Oral)   Resp 19   Ht 5\' 11"  (1.803 m)   Wt 215 lb 9.6 oz (97.8 kg)   SpO2 98%   BMI 30.07 kg/m    General appearance: alert, well developed, well nourished, cooperative and in no distress Head: Normocephalic, without obvious abnormality, atraumatic Respiratory: Respirations even with increased effort. Normal respiratory rate. Expiratory wheeze noted during exam  Extremities: No gross deformities Skin: Skin color, texture, turgor normal. No rashes seen  Psych: Appropriate mood and affect. Neurologic: Mental status: Alert, oriented to person, place, and time, thought content appropriate. Wt Readings from Last 3 Encounters:  10/15/18 215 lb 9.6 oz (97.8 kg)   09/04/18 203 lb 3.2 oz (92.2 kg)  06/27/14 203 lb 3.2 oz (92.2 kg)     Health Maintenance Due  Topic Date Due  . TETANUS/TDAP  03/17/2006  . INFLUENZA VACCINE  04/30/2018    There are no preventive care reminders to display for this patient.  Lab Results  Component Value Date   TSH 0.716 09/04/2018  Lab Results  Component Value Date   WBC 4.3 09/04/2018   HGB 16.9 09/04/2018   HCT 48.2 09/04/2018   MCV 94 09/04/2018   PLT 202 09/04/2018   Lab Results  Component Value Date   NA 138 09/04/2018   K 4.6 09/04/2018   CO2 25 09/04/2018   GLUCOSE 86 09/04/2018   BUN 9 09/04/2018   CREATININE 1.41 (H) 09/04/2018   BILITOT 0.7 09/04/2018   ALKPHOS 86 09/04/2018   AST 92 (H) 09/04/2018   ALT 45 (H) 09/04/2018   PROT 7.3 09/04/2018   ALBUMIN 4.6 09/04/2018   CALCIUM 9.7 09/04/2018    Lab Results  Component Value Date   HGBA1C 5.4 09/04/2018     Assessment & Plan:  1. Encounter for smoking cessation counseling 2. Shortness of breath - DG Chest 2 View; Future, rule out pneumonia  3. Acute bronchitis, unspecified organism Start Azithromycin Take 2 tabs x 1 dose, then 1 tab every day for x 4 days  Pending:  - DG Chest 2 View; Future - CBC with Differential  4. Wheezing Continue nebulizer treatments 2 puffs every 4 hours as needed for wheezing and alternate nebulizer treatments every 6 hours if wheezing worsens and is unresponsive to inhaler. Pending:  - DG Chest 2 View; Future  5. Abnormal renal function test Repeating renal function test.  6. Elevated liver enzymes - Hepatic Function Panel      Meds ordered this encounter  Medications  . albuterol (PROVENTIL) (2.5 MG/3ML) 0.083% nebulizer solution    Sig: Inhale 3 mLs into the lungs every 4 (four) hours as needed for wheezing or shortness of breath.    Dispense:  75 mL    Refill:  3  . azithromycin (ZITHROMAX) 250 MG tablet    Sig: Take 2 tabs PO x 1 dose, then 1 tab PO QD x 4 days    Dispense:  6  tablet    Refill:  0  . benzonatate (TESSALON) 100 MG capsule    Sig: Take 1-2 capsules (100-200 mg total) by mouth 3 (three) times daily as needed for cough.    Dispense:  60 capsule    Refill:  0  . montelukast (SINGULAIR) 10 MG tablet    Sig: Take 1 tablet (10 mg total) by mouth at bedtime.    Dispense:  30 tablet    Refill:  3  . predniSONE (DELTASONE) 20 MG tablet    Sig: Take 2 tablets (40 mg total) by mouth daily with breakfast for 5 days.    Dispense:  10 tablet    Refill:  0    Follow-up: No follow-ups on file.    Joaquin Courts, FNP

## 2018-10-15 NOTE — Patient Instructions (Addendum)
You will be notified via phone regarding x-ray and lab results. Take all medication as prescribed.  If wheezing or shortness of breath worsen, please seek medical attention. Once you have completed your antibiotics , you ok to get your flu vaccine.

## 2018-10-16 ENCOUNTER — Telehealth: Payer: Self-pay | Admitting: Family Medicine

## 2018-10-16 LAB — RENAL FUNCTION PANEL
Albumin: 4.6 g/dL (ref 3.5–5.5)
BUN/Creatinine Ratio: 6 — ABNORMAL LOW (ref 9–20)
BUN: 8 mg/dL (ref 6–20)
CO2: 22 mmol/L (ref 20–29)
Calcium: 9.3 mg/dL (ref 8.7–10.2)
Chloride: 102 mmol/L (ref 96–106)
Creatinine, Ser: 1.4 mg/dL — ABNORMAL HIGH (ref 0.76–1.27)
GFR calc Af Amer: 77 mL/min/{1.73_m2} (ref >59)
GFR, EST NON AFRICAN AMERICAN: 66 mL/min/{1.73_m2} (ref >59)
Glucose: 93 mg/dL (ref 65–99)
Phosphorus: 3.6 mg/dL (ref 2.8–4.1)
Potassium: 4.9 mmol/L (ref 3.5–5.2)
Sodium: 141 mmol/L (ref 134–144)

## 2018-10-16 LAB — CBC WITH DIFFERENTIAL/PLATELET
Basophils Absolute: 0 10*3/uL (ref 0.0–0.2)
Basos: 1 %
EOS (ABSOLUTE): 0.6 10*3/uL — ABNORMAL HIGH (ref 0.0–0.4)
Eos: 13 %
Hematocrit: 45.8 % (ref 37.5–51.0)
Hemoglobin: 16.3 g/dL (ref 13.0–17.7)
Immature Grans (Abs): 0 10*3/uL (ref 0.0–0.1)
Immature Granulocytes: 0 %
LYMPHS: 40 %
Lymphocytes Absolute: 2.1 10*3/uL (ref 0.7–3.1)
MCH: 33.1 pg — AB (ref 26.6–33.0)
MCHC: 35.6 g/dL (ref 31.5–35.7)
MCV: 93 fL (ref 79–97)
MONOCYTES: 15 %
Monocytes Absolute: 0.8 10*3/uL (ref 0.1–0.9)
Neutrophils Absolute: 1.6 10*3/uL (ref 1.4–7.0)
Neutrophils: 31 %
Platelets: 183 10*3/uL (ref 150–450)
RBC: 4.92 x10E6/uL (ref 4.14–5.80)
RDW: 13 % (ref 11.6–15.4)
WBC: 5.1 10*3/uL (ref 3.4–10.8)

## 2018-10-16 LAB — HEPATIC FUNCTION PANEL
ALT: 45 IU/L — AB (ref 0–44)
AST: 73 IU/L — ABNORMAL HIGH (ref 0–40)
Alkaline Phosphatase: 74 IU/L (ref 39–117)
BILIRUBIN TOTAL: 0.3 mg/dL (ref 0.0–1.2)
Bilirubin, Direct: 0.11 mg/dL (ref 0.00–0.40)
Total Protein: 7 g/dL (ref 6.0–8.5)

## 2018-10-16 NOTE — Telephone Encounter (Signed)
Notify patient that his recent chest x-ray was normal. I recommend obtaining the flu  shot once completing antibiotics.

## 2018-10-19 ENCOUNTER — Telehealth: Payer: Self-pay | Admitting: Family Medicine

## 2018-10-19 DIAGNOSIS — N289 Disorder of kidney and ureter, unspecified: Secondary | ICD-10-CM | POA: Insufficient documentation

## 2018-10-19 DIAGNOSIS — R748 Abnormal levels of other serum enzymes: Secondary | ICD-10-CM | POA: Insufficient documentation

## 2018-10-19 NOTE — Telephone Encounter (Signed)
Patient notified. Expressed understanding.

## 2018-10-19 NOTE — Telephone Encounter (Signed)
Notify patient that liver enzymes remain elevated although are slightly improving.  Renal function also remains slightly abnormal which I attribute to elevation of liver enzymes.  I would like to obtain a liver ultrasound to evaluate the cause of persistently elevated liver enzymes.  Order in system please contact patient to schedule study.

## 2018-10-20 NOTE — Telephone Encounter (Signed)
Patient notified of lab results & recommendations. Expressed understanding. States that he doesn't have his insurance card in front of him but will call office back to update insurance information in case ultrasound needs a prior authorization.

## 2018-11-27 ENCOUNTER — Ambulatory Visit (HOSPITAL_COMMUNITY): Payer: BLUE CROSS/BLUE SHIELD

## 2018-11-27 ENCOUNTER — Ambulatory Visit (HOSPITAL_COMMUNITY)
Admission: EM | Admit: 2018-11-27 | Discharge: 2018-11-27 | Disposition: A | Discharge status: Home / Self Care | Payer: BLUE CROSS/BLUE SHIELD | Attending: Family Medicine | Admitting: Family Medicine

## 2018-11-27 ENCOUNTER — Other Ambulatory Visit: Payer: Self-pay

## 2018-11-27 DIAGNOSIS — R05 Cough: Secondary | ICD-10-CM

## 2018-11-27 DIAGNOSIS — J4520 Mild intermittent asthma, uncomplicated: Secondary | ICD-10-CM

## 2018-11-27 DIAGNOSIS — R059 Cough, unspecified: Secondary | ICD-10-CM

## 2018-11-27 DIAGNOSIS — R0781 Pleurodynia: Secondary | ICD-10-CM

## 2018-11-27 MED ORDER — MELOXICAM 15 MG PO TABS: 15.0000 mg | ORAL_TABLET | Freq: Every day | ORAL | 1 refills | Status: DC

## 2018-11-27 MED ORDER — PREDNISONE 20 MG PO TABS: 40.0000 mg | ORAL_TABLET | Freq: Every day | ORAL | 0 refills | Status: AC

## 2018-11-27 MED ORDER — DICLOFENAC SODIUM 1 % TD GEL: 4.0000 g | Freq: Four times a day (QID) | TRANSDERMAL | 0 refills | Status: DC

## 2018-11-27 NOTE — Discharge Instructions (Addendum)
If no improvement, you may go to [facility] tomorrow for further evaluation.

## 2018-11-27 NOTE — ED Triage Notes (Addendum)
Per pt he was dx with flu in January and bronchitis and he says that has not gotten rid of cough. HX of Asthma and smokes.  Is using inhaler with some relief but not a lot. No fevers. Saying having right upper rib pain.

## 2018-11-27 NOTE — ED Provider Notes (Signed)
[facility]    CSN: [id number] Arrival date & time: 11/27/18  1706     History   Chief Complaint Chief Complaint  Patient presents with  . Cough    HPI Gilbert Morgan is a 33 y.o. male.   HPI  Right rib pain ongoing x 4 days. Pain is most pronounced with breathing in and out. Pain is not reproducible with palpation. He doesn't recall an injury to his right upper chest or rib area. He suffers from asthma and has had multiple coughing episodes prior to developing rib pain. Pain is characterized as aching and localized. He has no associated chest or back pain. He reports cough and shortness of breath improved yesterday. He has not attempted relief of pain with any over the counter medications. 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  Past Medical History:  Diagnosis Date  . Asthma    no inhaler use in > 1 yr.  . History of seizure age 41   x 1 - result of a head injury - was never on anticonvulsant med.  . Indigestion    occasional - no current med.  . Laceration of finger of right hand with tendon involvement 06/20/2014   index and long fingers    Patient Active Problem List   Diagnosis Date Noted  . Elevated liver enzymes 10/19/2018  . Abnormal renal function 10/19/2018    Past Surgical History:  Procedure Laterality Date  . NERVE, TENDON AND ARTERY REPAIR Right 06/27/2014   Procedure: REPAIR AND EXPLORE AS NEEDED NERVE, TENDON AND ARTERY OF THE RIGHT INDEX AND LONG FINGER;  Surgeon: Dairl Ponder, MD;  Location: [facility];  Service: Orthopedics;  Laterality: Right;  . NO PAST SURGERIES         Home Medications    Prior to Admission medications   Medication Sig Start Date End Date Taking? Authorizing Provider  acyclovir (ZOVIRAX) 400 MG tablet Take 1 tablet (400 mg total) by mouth 3 (three) times daily. 09/04/18   Bing Neighbors, FNP  albuterol (PROVENTIL) (2.5 MG/3ML) 0.083% nebulizer solution Inhale 3 mLs into  the lungs every 4 (four) hours as needed for wheezing or shortness of breath. 10/15/18   Bing Neighbors, FNP  azithromycin (ZITHROMAX) 250 MG tablet Take 2 tabs PO x 1 dose, then 1 tab PO QD x 4 days 10/15/18   Bing Neighbors, FNP  benzonatate (TESSALON) 100 MG capsule Take 1-2 capsules (100-200 mg total) by mouth 3 (three) times daily as needed for cough. 10/15/18   Bing Neighbors, FNP  montelukast (SINGULAIR) 10 MG tablet Take 1 tablet (10 mg total) by mouth at bedtime. 10/15/18   Bing Neighbors, FNP    Family History No family history on file.  Social History Social History   Tobacco Use  . Smoking status: Former Smoker    Packs/day: 0.50    Years: 8.00    Pack years: 4.00    Types: Cigarettes  . Smokeless tobacco: Never Used  Substance Use Topics  . Alcohol use: Yes    Comment: occasionally  . Drug use: Yes    Types: Marijuana    Comment: last used 06/14/2014     Allergies   Penicillins   Review of Systems Review of Systems Pertinent negatives listed in HPI   Physical Exam Triage Vital Signs ED Triage Vitals [11/27/18 1749]  Enc Vitals Group     BP 136/77  Pulse Rate 73     Resp 18     Temp 97.7 F (36.5 C)     Temp Source Oral     SpO2 99 %     Weight 200 lb (90.7 kg)     Height 5\' 10"  (1.778 m)     Head Circumference      Peak Flow      Pain Score      Pain Loc      Pain Edu?      Excl. in GC?    No data found.  Updated Vital Signs BP 136/77 (BP Location: Right Arm)   Pulse 73   Temp 97.7 F (36.5 C) (Oral)   Resp 18   Ht 5\' 10"  (1.778 m)   Wt 200 lb (90.7 kg)   SpO2 99%   BMI 28.70 kg/m   Visual Acuity Right Eye Distance:   Left Eye Distance:   Bilateral Distance:    Right Eye Near:   Left Eye Near:    Bilateral Near:     Physical Exam General appearance: alert, well developed, well nourished, non-ill appearing, cooperative and in no distress Head: Normocephalic, without obvious abnormality, atraumatic Heart:  Normal rate and rhythm  Respiratory: Respirations even and unlabored, normal respiratory rate Extremities: No gross deformities Skin: Skin color, texture, turgor normal. No rashes seen  Psych: Appropriate mood and affect. Neurologic: Mental status: Alert, oriented to person, place, and time, thought content appropriate.  UC Treatments / Results  Labs (all labs ordered are listed, but only abnormal results are displayed) Labs Reviewed - No data to display  EKG None  Radiology No results found.  Procedures Procedures (including critical care time)  Medications Ordered in UC Medications - No data to display  Initial Impression / Assessment and Plan / UC Course  I have reviewed the triage vital signs and the nursing notes.  Pertinent labs & imaging results that were available during my care of the patient were reviewed by me and considered in my medical decision making (see chart for details).   Patient presents with a complaint of right mid rib pain exacerbated by breathing. Initially recommended  Imaging of right rib and patient declined. Will trial antiinflammatory therapy and treat as a muscle strain. Patient given strict return precautions if symptoms worsen or do not improve. Patient verbalized understanding and agreement with plan.  Final Clinical Impressions(s) / UC Diagnoses   Final diagnoses:  Rib pain on right side  Cough  Intermittent asthma without complication, unspecified asthma severity     Discharge Instructions     If no improvement, you may go to [facility] tomorrow for further evaluation.    ED Prescriptions    Medication Sig Dispense Auth. Provider   predniSONE (DELTASONE) 20 MG tablet Take 2 tablets (40 mg total) by mouth daily with breakfast for 5 days. Start if wheezing or chest tightness occurs. Complete entire course 10 tablet Bing Neighbors, FNP   diclofenac sodium (VOLTAREN) 1 % GEL Apply 4 g topically 4 (four) times daily. 100 g  Bing Neighbors, FNP   meloxicam (MOBIC) 15 MG tablet Take 1 tablet (15 mg total) by mouth daily. 30 tablet Bing Neighbors, FNP     Controlled Substance Prescriptions [place] Controlled Substance Registry consulted? Not Applicable   Bing Neighbors, FNP 11/28/18 1655

## 2018-12-08 ENCOUNTER — Ambulatory Visit: Payer: Self-pay | Admitting: Family Medicine

## 2019-02-18 ENCOUNTER — Other Ambulatory Visit: Payer: Self-pay

## 2019-02-18 ENCOUNTER — Ambulatory Visit (HOSPITAL_COMMUNITY)
Admission: EM | Admit: 2019-02-18 | Discharge: 2019-02-18 | Disposition: A | Discharge status: Home / Self Care | Payer: BLUE CROSS/BLUE SHIELD | Attending: Family Medicine | Admitting: Family Medicine

## 2019-02-18 ENCOUNTER — Encounter (HOSPITAL_COMMUNITY): Payer: Self-pay

## 2019-02-18 DIAGNOSIS — L02215 Cutaneous abscess of perineum: Secondary | ICD-10-CM

## 2019-02-18 MED ORDER — SULFAMETHOXAZOLE-TRIMETHOPRIM 800-160 MG PO TABS: 1.0000 | ORAL_TABLET | Freq: Two times a day (BID) | ORAL | 0 refills | Status: AC

## 2019-02-18 MED ORDER — ACYCLOVIR 400 MG PO TABS: 400.0000 mg | ORAL_TABLET | Freq: Three times a day (TID) | ORAL | 2 refills | Status: DC

## 2019-02-18 NOTE — ED Triage Notes (Signed)
Patient presents to Urgent Care with complaints of right upper leg pain since yesterday. Patient reports there is a "knot" near his groin, google told him it may be a cyst. Pt states he has had problems with his prostate in the past.

## 2019-02-18 NOTE — Discharge Instructions (Addendum)
Take the antibiotic for 7 days Warm compresses to area Refill acyclovir Call if worse

## 2019-02-18 NOTE — ED Provider Notes (Signed)
[facility]    CSN: [id number] Arrival date & time: 02/18/19  1418     History   Chief Complaint Chief Complaint  Patient presents with  . Leg Pain    HPI Gilbert Morgan is a 33 y.o. male.   HPI  Patient has pain in his perineum since yesterday.  He states that there is a "knot" in the area.  Severely painful.  He has trouble sitting.  He is never had trouble with infections or abscess before.  He has had folliculitis.  No fever or chills.  Never had MRSA. 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  Asks as a favor, will refill his acyclovir.  He is reminded he needs a PCP for ongoing refills.  Past Medical History:  Diagnosis Date  . Asthma    no inhaler use in > 1 yr.  . History of seizure age 32   x 1 - result of a head injury - was never on anticonvulsant med.  . Indigestion    occasional - no current med.  . Laceration of finger of right hand with tendon involvement 06/20/2014   index and long fingers    Patient Active Problem List   Diagnosis Date Noted  . Elevated liver enzymes 10/19/2018  . Abnormal renal function 10/19/2018    Past Surgical History:  Procedure Laterality Date  . NERVE, TENDON AND ARTERY REPAIR Right 06/27/2014   Procedure: REPAIR AND EXPLORE AS NEEDED NERVE, TENDON AND ARTERY OF THE RIGHT INDEX AND LONG FINGER;  Surgeon: Dairl Ponder, MD;  Location: [facility];  Service: Orthopedics;  Laterality: Right;  . NO PAST SURGERIES         Home Medications    Prior to Admission medications   Medication Sig Start Date End Date Taking? Authorizing Provider  acyclovir (ZOVIRAX) 400 MG tablet Take 1 tablet (400 mg total) by mouth 3 (three) times daily. 02/18/19   Eustace Moore, MD  albuterol (PROVENTIL) (2.5 MG/3ML) 0.083% nebulizer solution Inhale 3 mLs into the lungs every 4 (four) hours as needed for wheezing or shortness of breath. 10/15/18   Bing Neighbors, FNP  diclofenac sodium (VOLTAREN) 1 % GEL Apply 4 g  topically 4 (four) times daily. 11/27/18   Bing Neighbors, FNP  montelukast (SINGULAIR) 10 MG tablet Take 1 tablet (10 mg total) by mouth at bedtime. 10/15/18   Bing Neighbors, FNP  sulfamethoxazole-trimethoprim (BACTRIM DS) 800-160 MG tablet Take 1 tablet by mouth 2 (two) times daily for 7 days. 02/18/19 02/25/19  Eustace Moore, MD    Family History Family History  Problem Relation Age of Onset  . Diabetes Mother   . Hypertension Mother   . Diabetes Father   . Hypertension Father     Social History Social History   Tobacco Use  . Smoking status: Former Smoker    Packs/day: 0.50    Years: 8.00    Pack years: 4.00    Types: Cigarettes  . Smokeless tobacco: Never Used  Substance Use Topics  . Alcohol use: Yes    Comment: occasionally  . Drug use: Yes    Types: Marijuana    Comment: last used 06/14/2014     Allergies   Penicillins   Review of Systems Review of Systems  Constitutional: Negative for chills and fever.  HENT: Negative for ear pain and sore throat.   Eyes: Negative for pain and visual disturbance.  Respiratory: Negative for cough and shortness of  breath.   Cardiovascular: Negative for chest pain and palpitations.  Gastrointestinal: Negative for abdominal pain and vomiting.  Genitourinary: Negative for dysuria and hematuria.  Musculoskeletal: Negative for arthralgias and back pain.  Skin: Positive for wound. Negative for color change and rash.  Neurological: Negative for seizures and syncope.  All other systems reviewed and are negative.    Physical Exam Triage Vital Signs ED Triage Vitals  Enc Vitals Group     BP 02/18/19 1443 (!) 143/87     Pulse Rate 02/18/19 1443 79     Resp 02/18/19 1443 18     Temp 02/18/19 1443 98.1 F (36.7 C)     Temp Source 02/18/19 1443 Oral     SpO2 02/18/19 1443 100 %     Weight --      Height --      Head Circumference --      Peak Flow --      Pain Score 02/18/19 1442 8     Pain Loc --      Pain Edu?  --      Excl. in GC? --    No data found.  Updated Vital Signs BP (!) 143/87 (BP Location: Left Arm)   Pulse 79   Temp 98.1 F (36.7 C) (Oral)   Resp 18   SpO2 100%   Visual Acuity Right Eye Distance:   Left Eye Distance:   Bilateral Distance:    Right Eye Near:   Left Eye Near:    Bilateral Near:     Physical Exam Constitutional:      General: He is not in acute distress.    Appearance: He is well-developed.  HENT:     Head: Normocephalic and atraumatic.  Eyes:     Conjunctiva/sclera: Conjunctivae normal.     Pupils: Pupils are equal, round, and reactive to light.  Neck:     Musculoskeletal: Normal range of motion.  Cardiovascular:     Rate and Rhythm: Normal rate.  Pulmonary:     Effort: Pulmonary effort is normal. No respiratory distress.  Abdominal:     General: There is no distension.     Palpations: Abdomen is soft.  Genitourinary:   Musculoskeletal: Normal range of motion.  Skin:    General: Skin is warm and dry.  Neurological:     Mental Status: He is alert.      UC Treatments / Results  Labs (all labs ordered are listed, but only abnormal results are displayed) Labs Reviewed - No data to display  EKG None  Radiology No results found.  Procedures Procedures (including critical care time)  Medications Ordered in UC Medications - No data to display  Initial Impression / Assessment and Plan / UC Course  I have reviewed the triage vital signs and the nursing notes.  Pertinent labs & imaging results that were available during my care of the patient were reviewed by me and considered in my medical decision making (see chart for details).     Discussed early abscess formation.  Warm compresses and antibiotics will often help.  This may rupture.  If it becomes larger or more painful, he may need to come back for I&D. Final Clinical Impressions(s) / UC Diagnoses   Final diagnoses:  Abscess of perineum     Discharge Instructions      Take the antibiotic for 7 days Warm compresses to area Refill acyclovir Call if worse    ED Prescriptions    Medication Sig Dispense  Auth. Provider   acyclovir (ZOVIRAX) 400 MG tablet Take 1 tablet (400 mg total) by mouth 3 (three) times daily. 21 tablet Eustace Moore, MD   sulfamethoxazole-trimethoprim (BACTRIM DS) 800-160 MG tablet Take 1 tablet by mouth 2 (two) times daily for 7 days. 14 tablet Eustace Moore, MD     Controlled Substance Prescriptions [place] Controlled Substance Registry consulted? Not Applicable   Eustace Moore, MD 02/18/19 [phone]

## 2019-02-28 ENCOUNTER — Ambulatory Visit (HOSPITAL_COMMUNITY)
Admission: EM | Admit: 2019-02-28 | Discharge: 2019-02-28 | Disposition: A | Discharge status: Home / Self Care | Payer: Self-pay | Attending: Family Medicine | Admitting: Family Medicine

## 2019-02-28 ENCOUNTER — Encounter (HOSPITAL_COMMUNITY): Payer: Self-pay | Admitting: Family Medicine

## 2019-02-28 ENCOUNTER — Other Ambulatory Visit: Payer: Self-pay

## 2019-02-28 DIAGNOSIS — R5381 Other malaise: Secondary | ICD-10-CM

## 2019-02-28 NOTE — ED Provider Notes (Signed)
[facility]    CSN: [id number] Arrival date & time: 02/28/19  1432     History   Chief Complaint No chief complaint on file.   HPI Gilbert Morgan is a 33 y.o. male.   33 yo man returns after being seen 10 days ago for perineal abscess.  He is seeking a back to work note.  He missed work yesterday when he felt tired and "under the weather."  He has a mild scratchy throat and a bit of congestion.  No cough or fever, no diarrhea, no extremity pain, no loss of sense of smell.  The perineum has cleared up.   Note from 02/18/19: Patient has pain in his perineum since yesterday.  He states that there is a "knot" in the area.  Severely painful.  He has trouble sitting.  He is never had trouble with infections or abscess before.  He has had folliculitis.  No fever or chills.  Never had MRSA. 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  Asks as a favor, will refill his acyclovir.  He is reminded he needs a PCP for ongoing refills.     Past Medical History:  Diagnosis Date  . Asthma    no inhaler use in > 1 yr.  . History of seizure age 89   x 1 - result of a head injury - was never on anticonvulsant med.  . Indigestion    occasional - no current med.  . Laceration of finger of right hand with tendon involvement 06/20/2014   index and long fingers    Patient Active Problem List   Diagnosis Date Noted  . Elevated liver enzymes 10/19/2018  . Abnormal renal function 10/19/2018    Past Surgical History:  Procedure Laterality Date  . NERVE, TENDON AND ARTERY REPAIR Right 06/27/2014   Procedure: REPAIR AND EXPLORE AS NEEDED NERVE, TENDON AND ARTERY OF THE RIGHT INDEX AND LONG FINGER;  Surgeon: Dairl PonderMatthew Weingold, MD;  Location: [facility];  Service: Orthopedics;  Laterality: Right;  . NO PAST SURGERIES         Home Medications    Prior to Admission medications   Medication Sig Start Date End Date Taking? Authorizing Provider  acyclovir (ZOVIRAX) 400 MG tablet  Take 1 tablet (400 mg total) by mouth 3 (three) times daily. 02/18/19   Eustace MooreNelson, Yvonne Sue, MD  albuterol (PROVENTIL) (2.5 MG/3ML) 0.083% nebulizer solution Inhale 3 mLs into the lungs every 4 (four) hours as needed for wheezing or shortness of breath. 10/15/18   Bing NeighborsHarris, Kimberly S, FNP  montelukast (SINGULAIR) 10 MG tablet Take 1 tablet (10 mg total) by mouth at bedtime. 10/15/18   Bing NeighborsHarris, Kimberly S, FNP    Family History Family History  Problem Relation Age of Onset  . Diabetes Mother   . Hypertension Mother   . Diabetes Father   . Hypertension Father     Social History Social History   Tobacco Use  . Smoking status: Former Smoker    Packs/day: 0.50    Years: 8.00    Pack years: 4.00    Types: Cigarettes  . Smokeless tobacco: Never Used  Substance Use Topics  . Alcohol use: Yes    Comment: occasionally  . Drug use: Yes    Types: Marijuana    Comment: last used 06/14/2014     Allergies   Penicillins   Review of Systems Review of Systems  All other systems reviewed and are negative.    Physical  Exam Triage Vital Signs ED Triage Vitals  Enc Vitals Group     BP      Pulse      Resp      Temp      Temp src      SpO2      Weight      Height      Head Circumference      Peak Flow      Pain Score      Pain Loc      Pain Edu?      Excl. in [place]?    No data found.  Updated Vital Signs BP 107/78 (BP Location: Right Arm)   Pulse 61   Temp 98.2 F (36.8 C) (Oral)   Resp 16   SpO2 100%    Physical Exam Vitals signs reviewed.  Constitutional:      Appearance: Normal appearance. He is normal weight.  HENT:     Head: Normocephalic.     Right Ear: External ear normal.     Left Ear: External ear normal.     Nose: Nose normal.     Mouth/Throat:     Pharynx: Oropharynx is clear.  Eyes:     Conjunctiva/sclera: Conjunctivae normal.  Neck:     Musculoskeletal: Normal range of motion and neck supple.  Cardiovascular:     Rate and Rhythm: Normal rate.      Heart sounds: Normal heart sounds.  Pulmonary:     Effort: Pulmonary effort is normal.     Breath sounds: Normal breath sounds.  Musculoskeletal: Normal range of motion.  Skin:    General: Skin is warm.  Neurological:     General: No focal deficit present.     Mental Status: He is alert.  Psychiatric:        Mood and Affect: Mood normal.      UC Treatments / Results  Labs (all labs ordered are listed, but only abnormal results are displayed) Labs Reviewed - No data to display  EKG None  Radiology No results found.  Procedures Procedures (including critical care time)  Medications Ordered in UC Medications - No data to display  Initial Impression / Assessment and Plan / UC Course  I have reviewed the triage vital signs and the nursing notes.  Pertinent labs & imaging results that were available during my care of the patient were reviewed by me and considered in my medical decision making (see chart for details).    Final Clinical Impressions(s) / UC Diagnoses   Final diagnoses:  Malaise   Discharge Instructions   None    ED Prescriptions    None     Controlled Substance Prescriptions [place] Controlled Substance Registry consulted? Not Applicable   Elvina Sidle, MD 02/28/19 1459

## 2019-02-28 NOTE — Discharge Instructions (Addendum)
Let us know if new symptoms develop

## 2019-02-28 NOTE — ED Triage Notes (Signed)
Per pt he was feeling very tired and felt dehydrated yesterday. Pt stayed home and slept and now feeling much better. Work is requiring a note to return. No fevers no chills no cough

## 2019-05-14 ENCOUNTER — Other Ambulatory Visit: Payer: Self-pay

## 2019-05-14 ENCOUNTER — Ambulatory Visit (HOSPITAL_COMMUNITY)
Admission: EM | Admit: 2019-05-14 | Discharge: 2019-05-14 | Disposition: A | Discharge status: Home / Self Care | Payer: 59

## 2019-05-14 ENCOUNTER — Encounter (HOSPITAL_COMMUNITY): Payer: Self-pay | Admitting: Emergency Medicine

## 2019-05-14 DIAGNOSIS — Z0289 Encounter for other administrative examinations: Secondary | ICD-10-CM | POA: Diagnosis not present

## 2019-05-14 DIAGNOSIS — Z7689 Persons encountering health services in other specified circumstances: Secondary | ICD-10-CM | POA: Diagnosis not present

## 2019-05-14 NOTE — ED Triage Notes (Signed)
PT reports he called out Tuesday for a headache. PT had a fever that day. Fever lasted for three hours. Headache resolved Tuesday. Fever resolved Tuesday. 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 PT does not feel that he has covid symptoms. Would like a note to return to work.

## 2019-05-14 NOTE — ED Provider Notes (Signed)
[facility]    CSN: [id number] Arrival date & time: 05/14/19  1257     History   Chief Complaint Chief Complaint  Patient presents with  . Follow-up    HPI Gilbert Morgan is a 33 y.o. male.   Patient is a 33 year old male with past medical history of asthma.  He presents today for note to return to work.  Reports that he had to call out of work due to headache and fever.  He believes that the fever was  associated to working outside.  He was working outside in the heat prior to having his temperature taken.  The headache and fever resolved that evening after going back inside and resting.  He did not take any medication for his symptoms.  Denies any associated cough, chest congestion, sore throat, ear pain, rhinorrhea, diarrhea, loss of taste or smell.  No known COVID exposures, sick contacts or recent traveling.  He is supposed to return to work today and would like to.   ROS per HPI      Past Medical History:  Diagnosis Date  . Asthma    no inhaler use in > 1 yr.  . History of seizure age 31   x 1 - result of a head injury - was never on anticonvulsant med.  . Indigestion    occasional - no current med.  . Laceration of finger of right hand with tendon involvement 06/20/2014   index and long fingers    Patient Active Problem List   Diagnosis Date Noted  . Elevated liver enzymes 10/19/2018  . Abnormal renal function 10/19/2018    Past Surgical History:  Procedure Laterality Date  . NERVE, TENDON AND ARTERY REPAIR Right 06/27/2014   Procedure: REPAIR AND EXPLORE AS NEEDED NERVE, TENDON AND ARTERY OF THE RIGHT INDEX AND LONG FINGER;  Surgeon: Dairl PonderMatthew Weingold, MD;  Location: [facility];  Service: Orthopedics;  Laterality: Right;  . NO PAST SURGERIES         Home Medications    Prior to Admission medications   Medication Sig Start Date End Date Taking? Authorizing Provider  acyclovir (ZOVIRAX) 400 MG tablet Take 1 tablet (400 mg  total) by mouth 3 (three) times daily. 02/18/19   Eustace MooreNelson, Yvonne Sue, MD  albuterol (PROVENTIL) (2.5 MG/3ML) 0.083% nebulizer solution Inhale 3 mLs into the lungs every 4 (four) hours as needed for wheezing or shortness of breath. 10/15/18   Bing NeighborsHarris, Kimberly S, FNP  montelukast (SINGULAIR) 10 MG tablet Take 1 tablet (10 mg total) by mouth at bedtime. 10/15/18   Bing NeighborsHarris, Kimberly S, FNP    Family History Family History  Problem Relation Age of Onset  . Diabetes Mother   . Hypertension Mother   . Diabetes Father   . Hypertension Father     Social History Social History   Tobacco Use  . Smoking status: Former Smoker    Packs/day: 0.50    Years: 8.00    Pack years: 4.00    Types: Cigarettes  . Smokeless tobacco: Never Used  Substance Use Topics  . Alcohol use: Yes    Comment: occasionally  . Drug use: Yes    Types: Marijuana    Comment: last used 06/14/2014     Allergies   Penicillins   Review of Systems Review of Systems   Physical Exam Triage Vital Signs ED Triage Vitals  Enc Vitals Group     BP 05/14/19 1350 119/73     Pulse  Rate 05/14/19 1350 65     Resp 05/14/19 1350 16     Temp 05/14/19 1350 98.3 F (36.8 C)     Temp Source 05/14/19 1350 Oral     SpO2 05/14/19 1350 97 %     Weight --      Height --      Head Circumference --      Peak Flow --      Pain Score 05/14/19 1348 0     Pain Loc --      Pain Edu? --      Excl. in [place]? --    No data found.  Updated Vital Signs BP 119/73 (BP Location: Right Arm)   Pulse 65   Temp 98.3 F (36.8 C) (Oral)   Resp 16   SpO2 97%   Visual Acuity Right Eye Distance:   Left Eye Distance:   Bilateral Distance:    Right Eye Near:   Left Eye Near:    Bilateral Near:     Physical Exam Vitals signs and nursing note reviewed.  Constitutional:      Appearance: Normal appearance.  HENT:     Head: Normocephalic and atraumatic.     Nose: Nose normal.  Eyes:     Conjunctiva/sclera: Conjunctivae normal.  Neck:      Musculoskeletal: Normal range of motion.  Pulmonary:     Effort: Pulmonary effort is normal.  Musculoskeletal: Normal range of motion.  Skin:    General: Skin is warm and dry.  Neurological:     Mental Status: He is alert.  Psychiatric:        Mood and Affect: Mood normal.      UC Treatments / Results  Labs (all labs ordered are listed, but only abnormal results are displayed) Labs Reviewed - No data to display  EKG   Radiology No results found.  Procedures Procedures (including critical care time)  Medications Ordered in UC Medications - No data to display  Initial Impression / Assessment and Plan / UC Course  I have reviewed the triage vital signs and the nursing notes.  Pertinent labs & imaging results that were available during my care of the patient were reviewed by me and considered in my medical decision making (see chart for details).      Not likely patient's symptoms were associated with COVID. likely overheated from working outside I feel it is safe for him to return to work. Work note given to return today  Final Clinical Impressions(s) / UC Diagnoses   Final diagnoses:  Return to work evaluation     Discharge Instructions     I feel it is safe for you to return to work. Not convinced of any COVID illness    ED Prescriptions    None     Controlled Substance Prescriptions [place] Controlled Substance Registry consulted? Not Applicable   Orvan July, NP 05/14/19 1421

## 2019-05-14 NOTE — Discharge Instructions (Signed)
I feel it is safe for you to return to work. Not convinced of any COVID illness

## 2020-06-14 NOTE — Telephone Encounter (Signed)
Created in error. Kysa Calais, OTR/L 

## 2020-07-29 IMAGING — DX DG CHEST 2V
2 series · 2 of 2 positions shown · non-contrast
Comparison: 12/10/2009

CLINICAL DATA: Chest pain and shortness of breath

EXAM:
CHEST - 2 VIEW

[chest pa]
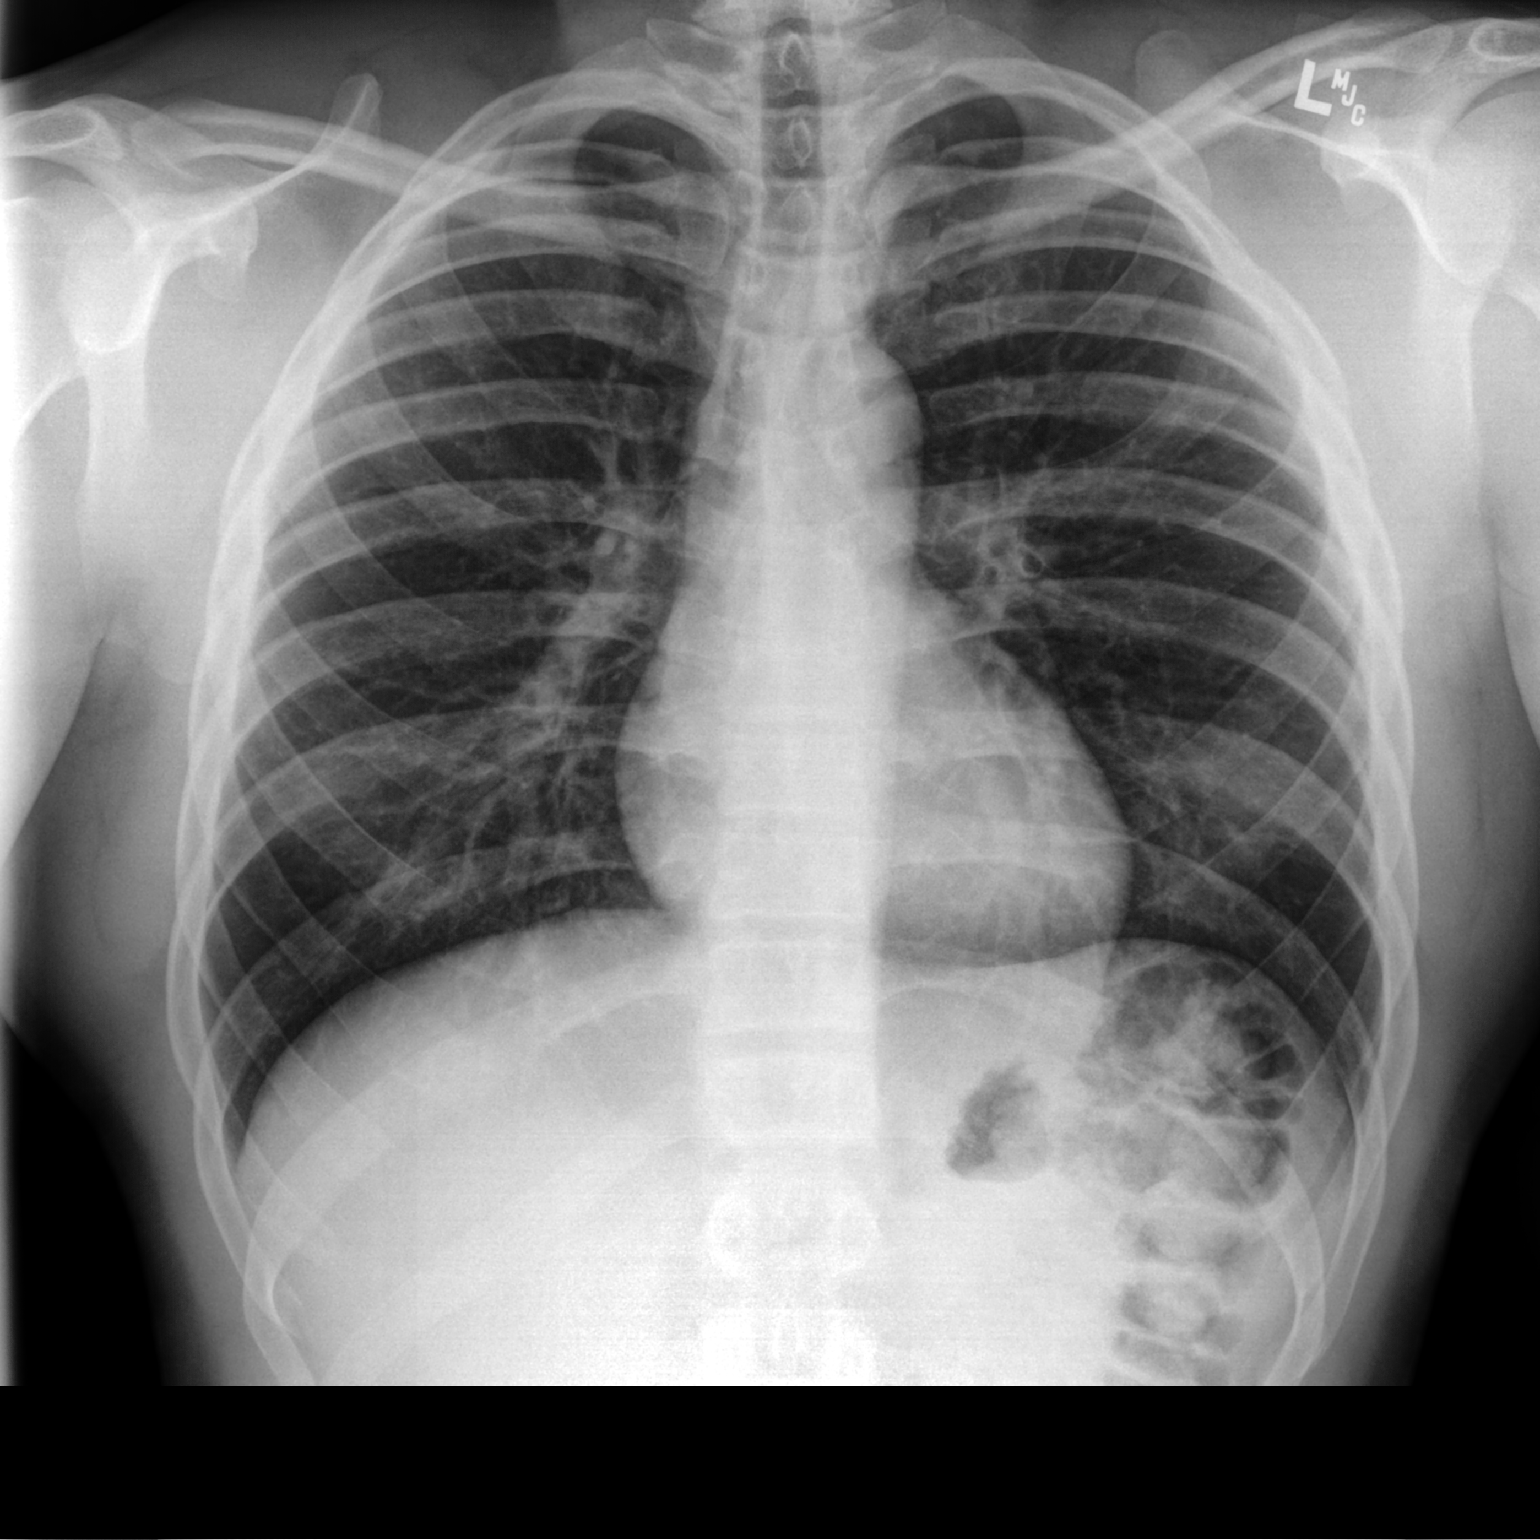

[chest lat]
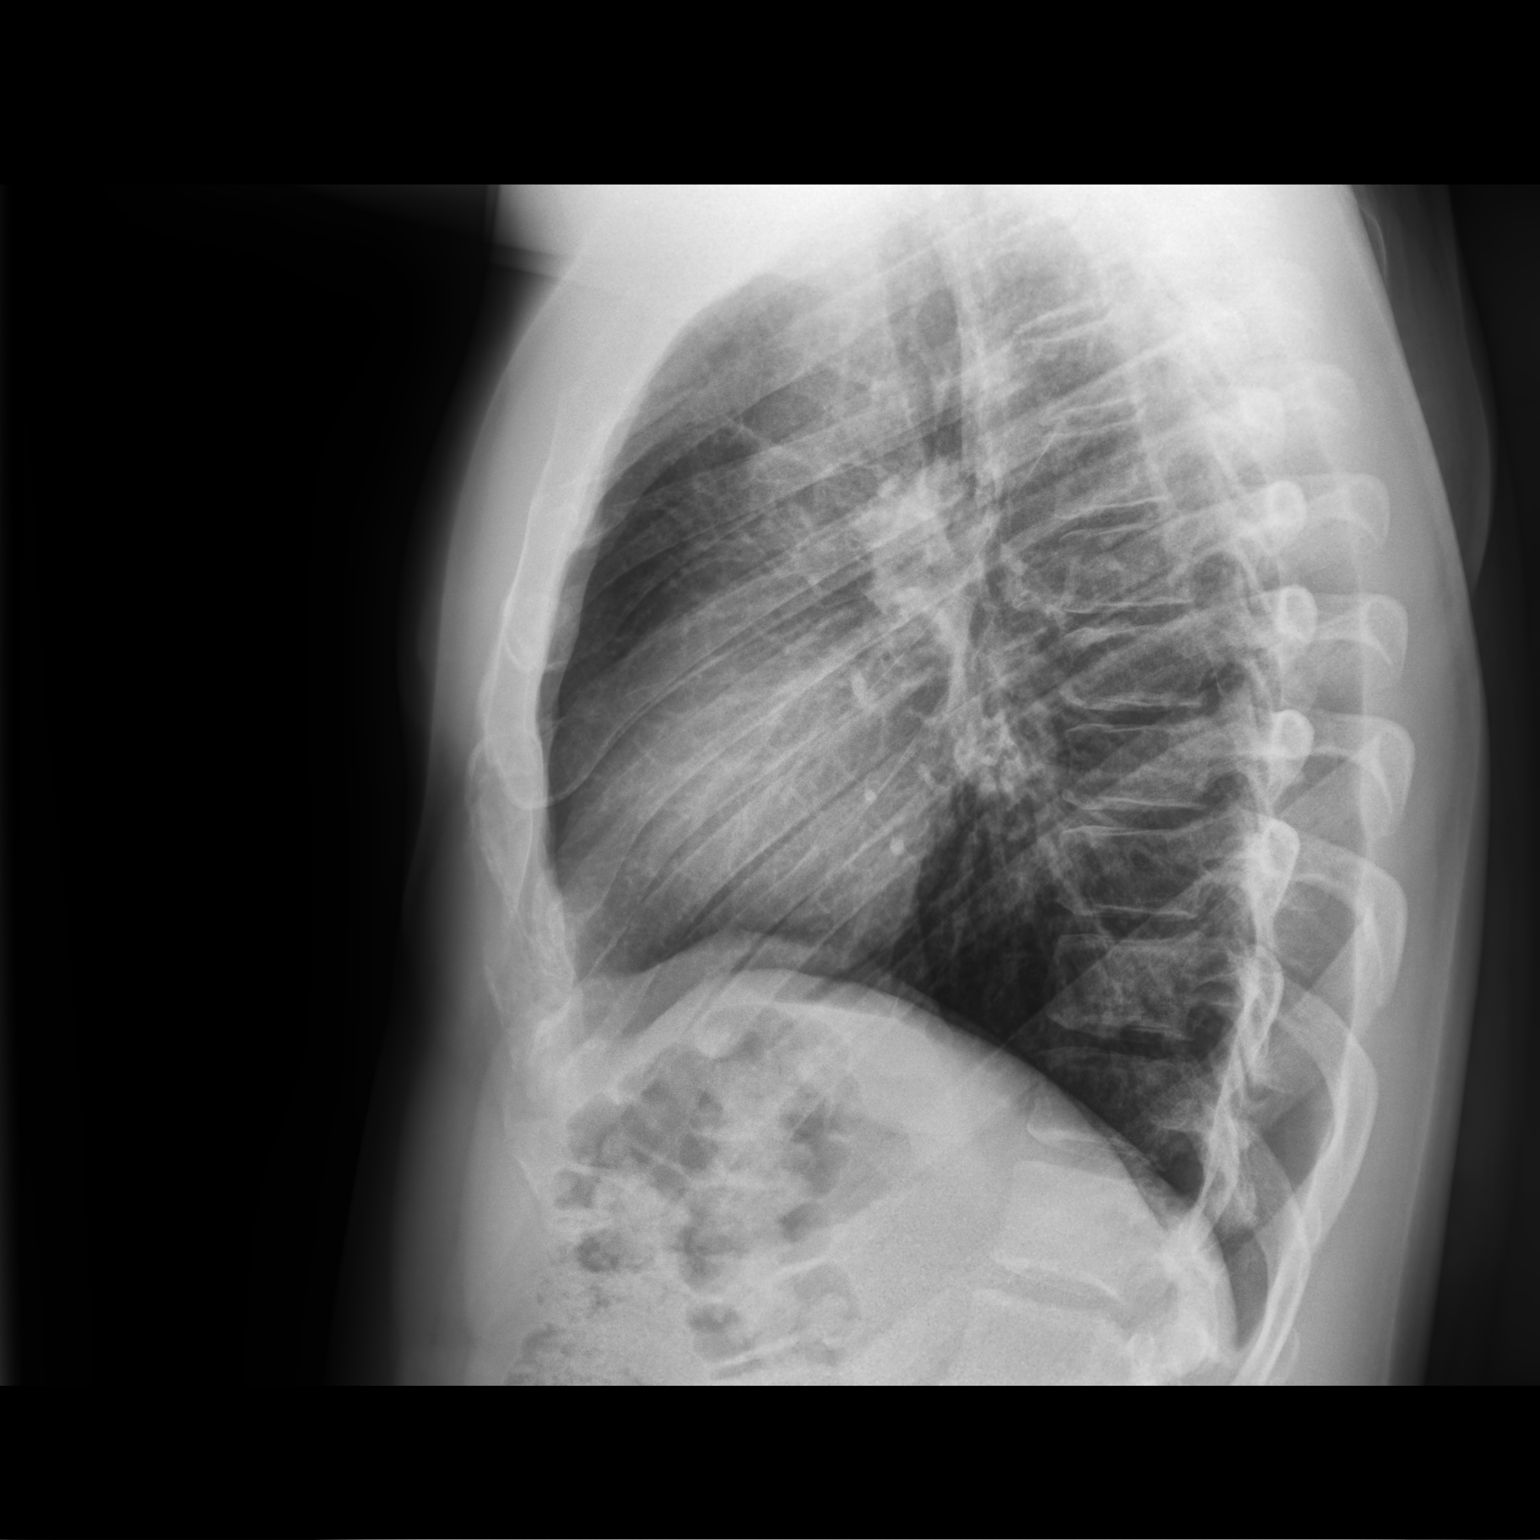

[2 of 2 positions shown; findings below may reference images not displayed]

FINDINGS: The heart size and mediastinal contours are within normal limits.
Both lungs are clear. The visualized skeletal structures are
unremarkable.
IMPRESSION: No active cardiopulmonary disease.

## 2021-02-08 ENCOUNTER — Ambulatory Visit (HOSPITAL_COMMUNITY)
Admission: EM | Admit: 2021-02-08 | Discharge: 2021-02-08 | Disposition: A | Discharge status: Home / Self Care | Payer: Managed Care, Other (non HMO) | Attending: Physician Assistant | Admitting: Physician Assistant

## 2021-02-08 ENCOUNTER — Encounter (HOSPITAL_COMMUNITY): Payer: Self-pay

## 2021-02-08 ENCOUNTER — Other Ambulatory Visit: Payer: Self-pay

## 2021-02-08 DIAGNOSIS — B029 Zoster without complications: Secondary | ICD-10-CM | POA: Diagnosis not present

## 2021-02-08 DIAGNOSIS — R238 Other skin changes: Secondary | ICD-10-CM | POA: Diagnosis not present

## 2021-02-08 MED ORDER — VALACYCLOVIR HCL 1 G PO TABS: 1000.0000 mg | ORAL_TABLET | Freq: Three times a day (TID) | ORAL | 0 refills | Status: AC

## 2021-02-08 NOTE — ED Triage Notes (Signed)
Pt c/o possible bed bugs.

## 2021-02-08 NOTE — Discharge Instructions (Addendum)
This looks most consistently like shingles.  Please take Valtrex which is an antiviral 3 times a day for a week.  If this starts to get close to your eye or you have any vision changes you need to be seen immediately as this can cause permanent blindness.  Can take Tylenol and ibuprofen for symptom relief.  If anything worsens please return for reevaluation.

## 2021-02-08 NOTE — ED Provider Notes (Signed)
[facility]    CSN: [id number] Arrival date & time: 02/08/21  1233      History   Chief Complaint Chief Complaint  Patient presents with  . Insect Bite    Bed bugs   . Generalized Body Aches    HPI Gilbert Morgan is a 35 y.o. male.   Patient presents today with a several day history of rash on his left forehead.  He is concerned that this is related to bedbugs as he recently traveled to [place] and stayed at a resort.  No one else in his family has any similar lesions.  He did not see any bugs but did have someone look at the mattress where several red specks were noted thought to be blood drops from possible insect.  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  He was the only one who slept on this particular piece of furniture and reports this portion of his face was in direct contact with mattress.  He reports some burning and associated pain.  Denies episodes of similar symptoms in the past. Does report generally not feeling well and some body aches.  Denies any fever, vision changes, spread of rash, additional areas of rash.  Denies any recent medication changes, antibiotic use, changes to personal hygiene products.     Past Medical History:  Diagnosis Date  . Asthma    no inhaler use in > 1 yr.  . History of seizure age 64   x 1 - result of a head injury - was never on anticonvulsant med.  . Indigestion    occasional - no current med.  . Laceration of finger of right hand with tendon involvement 06/20/2014   index and long fingers    Patient Active Problem List   Diagnosis Date Noted  . Elevated liver enzymes 10/19/2018  . Abnormal renal function 10/19/2018    Past Surgical History:  Procedure Laterality Date  . NERVE, TENDON AND ARTERY REPAIR Right 06/27/2014   Procedure: REPAIR AND EXPLORE AS NEEDED NERVE, TENDON AND ARTERY OF THE RIGHT INDEX AND LONG FINGER;  Surgeon: Dairl Ponder, MD;  Location: [facility];  Service: Orthopedics;  Laterality: Right;  . NO PAST SURGERIES         Home Medications    Prior to Admission medications   Medication Sig Start Date End Date Taking? Authorizing Provider  valACYclovir (VALTREX) 1000 MG tablet Take 1 tablet (1,000 mg total) by mouth 3 (three) times daily for 14 days. 02/08/21 02/22/21 Yes Charese Abundis K, PA-C  albuterol (PROVENTIL) (2.5 MG/3ML) 0.083% nebulizer solution Inhale 3 mLs into the lungs every 4 (four) hours as needed for wheezing or shortness of breath. 10/15/18   Bing Neighbors, FNP  montelukast (SINGULAIR) 10 MG tablet Take 1 tablet (10 mg total) by mouth at bedtime. 10/15/18   Bing Neighbors, FNP    Family History Family History  Problem Relation Age of Onset  . Diabetes Mother   . Hypertension Mother   . Diabetes Father   . Hypertension Father     Social History Social History   Tobacco Use  . Smoking status: Former Smoker    Packs/day: 0.50    Years: 8.00    Pack years: 4.00    Types: Cigarettes  . Smokeless tobacco: Never Used  Vaping Use  . Vaping Use: Never used  Substance Use Topics  .  Alcohol use: Yes    Comment: occasionally  . Drug use: Yes    Types: Marijuana    Comment: last used 06/14/2014     Allergies   Penicillins   Review of Systems Review of Systems  Constitutional: Negative for activity change, appetite change, fatigue and fever.  Respiratory: Negative for cough and shortness of breath.   Cardiovascular: Negative for chest pain.  Gastrointestinal: Negative for abdominal pain, diarrhea, nausea and vomiting.  Musculoskeletal: Negative for arthralgias and myalgias.  Skin: Positive for rash.  Neurological: Negative for dizziness, light-headedness and headaches.     Physical Exam Triage Vital Signs ED Triage Vitals  Enc Vitals Group     BP 02/08/21 1352 136/82     Pulse Rate 02/08/21 1352 68     Resp 02/08/21 1352 18     Temp --       Temp Source 02/08/21 1352 Oral     SpO2 02/08/21 1352 100 %     Weight --      Height --      Head Circumference --      Peak Flow --      Pain Score 02/08/21 1347 7     Pain Loc --      Pain Edu? --      Excl. in [place]? --    No data found.  Updated Vital Signs BP 136/82 (BP Location: Right Arm)   Pulse 68   Resp 18   SpO2 100%   Visual Acuity Right Eye Distance:   Left Eye Distance:   Bilateral Distance:    Right Eye Near:   Left Eye Near:    Bilateral Near:     Physical Exam Vitals reviewed.  Constitutional:      General: He is awake.     Appearance: Normal appearance. He is normal weight. He is not ill-appearing.     Comments: Very pleasant male appears stated age in no acute distress  HENT:     Head: Normocephalic and atraumatic.     Mouth/Throat:     Pharynx: Uvula midline. No oropharyngeal exudate or posterior oropharyngeal erythema.  Cardiovascular:     Rate and Rhythm: Normal rate and regular rhythm.     Heart sounds: No murmur heard.   Pulmonary:     Effort: Pulmonary effort is normal.     Breath sounds: Normal breath sounds. No stridor. No wheezing, rhonchi or rales.     Comments: Clear to auscultation bilaterally Lymphadenopathy:     Head:     Right side of head: No submental, submandibular, tonsillar, preauricular or posterior auricular adenopathy.     Left side of head: Preauricular adenopathy present. No submental, submandibular, tonsillar or posterior auricular adenopathy.     Cervical: No cervical adenopathy.  Skin:    Findings: Rash present. Rash is vesicular.     Comments: Vesicular rash involving left forehead not involving eye that does not cross midline  Neurological:     Mental Status: He is alert.  Psychiatric:        Behavior: Behavior is cooperative.         UC Treatments / Results  Labs (all labs ordered are listed, but only abnormal results are displayed) Labs Reviewed - No data to display  EKG   Radiology No results  found.  Procedures Procedures (including critical care time)  Medications Ordered in UC Medications - No data to display  Initial Impression / Assessment and Plan / UC Course  I  have reviewed the triage vital signs and the nursing notes.  Pertinent labs & imaging results that were available during my care of the patient were reviewed by me and considered in my medical decision making (see chart for details).     Discussed that symptoms are most consistent with shingles.  Had Roosvelt Maser, PA look at second opinion who agreed with diagnosis.  Patient started on Valtrex.  Discussed that if lesion spread closer to his eye or if he has any visual disturbance he needs to go directly to the emergency room.  He can alternate Tylenol and ibuprofen for pain relief.  Strict return precautions given to which patient expressed understanding.  Final Clinical Impressions(s) / UC Diagnoses   Final diagnoses:  Vesicular rash  Herpes zoster without complication     Discharge Instructions     This looks most consistently like shingles.  Please take Valtrex which is an antiviral 3 times a day for a week.  If this starts to get close to your eye or you have any vision changes you need to be seen immediately as this can cause permanent blindness.  Can take Tylenol and ibuprofen for symptom relief.  If anything worsens please return for reevaluation.    ED Prescriptions    Medication Sig Dispense Auth. Provider   valACYclovir (VALTREX) 1000 MG tablet Take 1 tablet (1,000 mg total) by mouth 3 (three) times daily for 14 days. 21 tablet Leondro Coryell, Noberto Retort, PA-C     PDMP not reviewed this encounter.   Jeani Hawking, PA-C 02/08/21 1449

## 2021-02-08 NOTE — ED Triage Notes (Signed)
Pt states he stayed at a resort over the weekend and is concerned of bed bugs.

## 2021-02-10 ENCOUNTER — Other Ambulatory Visit: Payer: Self-pay

## 2021-02-10 ENCOUNTER — Emergency Department (HOSPITAL_COMMUNITY)
Admission: EM | Admit: 2021-02-10 | Discharge: 2021-02-10 | Disposition: A | Discharge status: Home / Self Care | Payer: Managed Care, Other (non HMO) | Attending: Emergency Medicine | Admitting: Emergency Medicine

## 2021-02-10 ENCOUNTER — Ambulatory Visit (HOSPITAL_COMMUNITY): Admission: EM | Admit: 2021-02-10 | Discharge: 2021-02-10 | Payer: Managed Care, Other (non HMO)

## 2021-02-10 DIAGNOSIS — Z87891 Personal history of nicotine dependence: Secondary | ICD-10-CM | POA: Diagnosis not present

## 2021-02-10 DIAGNOSIS — B029 Zoster without complications: Secondary | ICD-10-CM | POA: Insufficient documentation

## 2021-02-10 DIAGNOSIS — J45909 Unspecified asthma, uncomplicated: Secondary | ICD-10-CM | POA: Diagnosis not present

## 2021-02-10 DIAGNOSIS — R21 Rash and other nonspecific skin eruption: Secondary | ICD-10-CM | POA: Diagnosis present

## 2021-02-10 MED ORDER — TETRACAINE HCL 0.5 % OP SOLN
2.0000 [drp] | Freq: Once | OPHTHALMIC | Status: DC
  Filled 2021-02-10: qty 4

## 2021-02-10 MED ORDER — PREDNISONE 10 MG PO TABS: 50.0000 mg | ORAL_TABLET | Freq: Every day | ORAL | 0 refills | Status: AC

## 2021-02-10 MED ORDER — HYDROCORTISONE 1 % EX CREA: TOPICAL_CREAM | CUTANEOUS | 0 refills | Status: AC

## 2021-02-10 MED ORDER — FLUORESCEIN SODIUM 1 MG OP STRP
1.0000 | ORAL_STRIP | Freq: Once | OPHTHALMIC | Status: AC
  Administered 2021-02-10: 1 via OPHTHALMIC
  Filled 2021-02-10: qty 1
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
## 2021-02-10 NOTE — ED Notes (Signed)
Dr Dorothey Baseman at bedside

## 2021-02-10 NOTE — Discharge Instructions (Signed)
Come back for blurry vision, eye pain, eye redness, or other emergencies.  Take the prednisone as prescribed and you may use the topical cream also as prescribed.  Continue your valacyclovir.

## 2021-02-10 NOTE — ED Triage Notes (Signed)
Pt reports being dx with shingles on Thursday and starting Valacyclovir. Now having increased pain and swelling to L eye. Sent here by UC for further eval.

## 2021-02-10 NOTE — ED Notes (Signed)
Patient is being discharged from the Urgent Care and sent to the Emergency Department via personal vehicle . Per provider Jonny Ruiz Linquist, patient is in need of higher level of care due to shingles near eye & on face progressing. Patient is aware and verbalizes understanding of plan of care. There were no vitals filed for this visit.

## 2022-01-17 DIAGNOSIS — R69 Illness, unspecified: Secondary | ICD-10-CM | POA: Diagnosis not present

## 2022-01-17 DIAGNOSIS — B360 Pityriasis versicolor: Secondary | ICD-10-CM | POA: Diagnosis not present

## 2022-01-17 DIAGNOSIS — F32A Depression, unspecified: Secondary | ICD-10-CM | POA: Diagnosis not present

## 2022-01-17 DIAGNOSIS — J45909 Unspecified asthma, uncomplicated: Secondary | ICD-10-CM | POA: Diagnosis not present

## 2022-02-19 DIAGNOSIS — R69 Illness, unspecified: Secondary | ICD-10-CM | POA: Diagnosis not present

## 2022-02-19 DIAGNOSIS — F32A Depression, unspecified: Secondary | ICD-10-CM | POA: Diagnosis not present

## 2022-02-19 DIAGNOSIS — F902 Attention-deficit hyperactivity disorder, combined type: Secondary | ICD-10-CM | POA: Diagnosis not present

## 2022-03-13 ENCOUNTER — Ambulatory Visit (INDEPENDENT_AMBULATORY_CARE_PROVIDER_SITE_OTHER): Payer: Managed Care, Other (non HMO) | Admitting: Primary Care

## 2022-03-22 DIAGNOSIS — R69 Illness, unspecified: Secondary | ICD-10-CM | POA: Diagnosis not present

## 2022-03-22 DIAGNOSIS — Z79899 Other long term (current) drug therapy: Secondary | ICD-10-CM | POA: Diagnosis not present

## 2022-03-22 DIAGNOSIS — R6882 Decreased libido: Secondary | ICD-10-CM | POA: Diagnosis not present

## 2022-03-22 DIAGNOSIS — F909 Attention-deficit hyperactivity disorder, unspecified type: Secondary | ICD-10-CM | POA: Diagnosis not present

## 2022-03-22 DIAGNOSIS — Z13220 Encounter for screening for lipoid disorders: Secondary | ICD-10-CM | POA: Diagnosis not present

## 2022-03-22 DIAGNOSIS — F32A Depression, unspecified: Secondary | ICD-10-CM | POA: Diagnosis not present

## 2022-03-22 DIAGNOSIS — E559 Vitamin D deficiency, unspecified: Secondary | ICD-10-CM | POA: Diagnosis not present

## 2022-03-22 DIAGNOSIS — Z131 Encounter for screening for diabetes mellitus: Secondary | ICD-10-CM | POA: Diagnosis not present

## 2022-03-22 DIAGNOSIS — F902 Attention-deficit hyperactivity disorder, combined type: Secondary | ICD-10-CM | POA: Diagnosis not present

## 2024-07-20 DIAGNOSIS — F331 Major depressive disorder, recurrent, moderate: Secondary | ICD-10-CM | POA: Diagnosis not present

## 2024-07-20 DIAGNOSIS — R5383 Other fatigue: Secondary | ICD-10-CM | POA: Diagnosis not present

## 2024-07-20 DIAGNOSIS — Z6832 Body mass index (BMI) 32.0-32.9, adult: Secondary | ICD-10-CM | POA: Diagnosis not present

## 2024-07-20 DIAGNOSIS — F902 Attention-deficit hyperactivity disorder, combined type: Secondary | ICD-10-CM | POA: Diagnosis not present

## 2024-07-20 DIAGNOSIS — F5101 Primary insomnia: Secondary | ICD-10-CM | POA: Diagnosis not present

## 2024-07-20 DIAGNOSIS — F418 Other specified anxiety disorders: Secondary | ICD-10-CM | POA: Diagnosis not present

## 2024-07-20 DIAGNOSIS — Z79899 Other long term (current) drug therapy: Secondary | ICD-10-CM | POA: Diagnosis not present

## 2024-07-20 DIAGNOSIS — I10 Essential (primary) hypertension: Secondary | ICD-10-CM | POA: Diagnosis not present

## 2024-08-05 DIAGNOSIS — F902 Attention-deficit hyperactivity disorder, combined type: Secondary | ICD-10-CM | POA: Diagnosis not present

## 2024-08-05 DIAGNOSIS — F411 Generalized anxiety disorder: Secondary | ICD-10-CM | POA: Diagnosis not present

## 2024-08-12 DIAGNOSIS — F411 Generalized anxiety disorder: Secondary | ICD-10-CM | POA: Diagnosis not present

## 2024-08-12 DIAGNOSIS — F902 Attention-deficit hyperactivity disorder, combined type: Secondary | ICD-10-CM | POA: Diagnosis not present

## 2024-08-19 DIAGNOSIS — F411 Generalized anxiety disorder: Secondary | ICD-10-CM | POA: Diagnosis not present

## 2024-08-19 DIAGNOSIS — F902 Attention-deficit hyperactivity disorder, combined type: Secondary | ICD-10-CM | POA: Diagnosis not present

## 2024-08-20 DIAGNOSIS — A6000 Herpesviral infection of urogenital system, unspecified: Secondary | ICD-10-CM | POA: Diagnosis not present

## 2024-08-20 DIAGNOSIS — F331 Major depressive disorder, recurrent, moderate: Secondary | ICD-10-CM | POA: Diagnosis not present

## 2024-08-20 DIAGNOSIS — Z6832 Body mass index (BMI) 32.0-32.9, adult: Secondary | ICD-10-CM | POA: Diagnosis not present

## 2024-08-20 DIAGNOSIS — F902 Attention-deficit hyperactivity disorder, combined type: Secondary | ICD-10-CM | POA: Diagnosis not present

## 2024-08-20 DIAGNOSIS — F5101 Primary insomnia: Secondary | ICD-10-CM | POA: Diagnosis not present

## 2024-08-20 DIAGNOSIS — I10 Essential (primary) hypertension: Secondary | ICD-10-CM | POA: Diagnosis not present

## 2024-08-20 DIAGNOSIS — F418 Other specified anxiety disorders: Secondary | ICD-10-CM | POA: Diagnosis not present

## 2024-09-01 ENCOUNTER — Ambulatory Visit (INDEPENDENT_AMBULATORY_CARE_PROVIDER_SITE_OTHER): Payer: Self-pay | Admitting: Licensed Clinical Social Worker

## 2024-09-01 ENCOUNTER — Encounter (HOSPITAL_COMMUNITY): Payer: Self-pay | Admitting: Licensed Clinical Social Worker

## 2024-09-01 ENCOUNTER — Encounter (HOSPITAL_COMMUNITY): Payer: Self-pay

## 2024-09-01 DIAGNOSIS — F319 Bipolar disorder, unspecified: Secondary | ICD-10-CM | POA: Insufficient documentation
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
## 2024-09-15 DIAGNOSIS — I10 Essential (primary) hypertension: Secondary | ICD-10-CM | POA: Diagnosis not present

## 2024-09-15 DIAGNOSIS — F5101 Primary insomnia: Secondary | ICD-10-CM | POA: Diagnosis not present

## 2024-09-15 DIAGNOSIS — Z6832 Body mass index (BMI) 32.0-32.9, adult: Secondary | ICD-10-CM | POA: Diagnosis not present

## 2024-09-15 DIAGNOSIS — F331 Major depressive disorder, recurrent, moderate: Secondary | ICD-10-CM | POA: Diagnosis not present

## 2024-09-15 DIAGNOSIS — A6000 Herpesviral infection of urogenital system, unspecified: Secondary | ICD-10-CM | POA: Diagnosis not present

## 2024-09-15 DIAGNOSIS — F418 Other specified anxiety disorders: Secondary | ICD-10-CM | POA: Diagnosis not present

## 2024-09-15 DIAGNOSIS — F902 Attention-deficit hyperactivity disorder, combined type: Secondary | ICD-10-CM | POA: Diagnosis not present

## 2024-10-18 ENCOUNTER — Encounter (HOSPITAL_COMMUNITY): Payer: Self-pay | Admitting: Psychiatry

## 2024-10-18 ENCOUNTER — Ambulatory Visit (INDEPENDENT_AMBULATORY_CARE_PROVIDER_SITE_OTHER): Admitting: Psychiatry

## 2024-10-18 VITALS — BP 170/87 | HR 92 | Temp 98.3°F | Ht 72.0 in | Wt 233.2 lb

## 2024-10-18 DIAGNOSIS — F4310 Post-traumatic stress disorder, unspecified: Secondary | ICD-10-CM

## 2024-10-18 DIAGNOSIS — F1290 Cannabis use, unspecified, uncomplicated: Secondary | ICD-10-CM | POA: Diagnosis not present

## 2024-10-18 DIAGNOSIS — F411 Generalized anxiety disorder: Secondary | ICD-10-CM

## 2024-10-18 MED ORDER — HYDROXYZINE HCL 10 MG PO TABS: 10.0000 mg | ORAL_TABLET | Freq: Three times a day (TID) | ORAL | 3 refills | Status: AC | PRN

## 2024-10-18 NOTE — Progress Notes (Signed)
 " Psychiatric Initial Adult Assessment    Patient Identification: Gilbert Morgan MRN:  [id number] Date of Evaluation:  10/18/2024 Referral Source: Walk-in Chief Complaint:   I need something for anxiety Visit Diagnosis:    ICD-10-CM   1. Marijuana use  F12.90 Ambulatory referral to Social Work    2. PTSD (post-traumatic stress disorder)  F43.10 Ambulatory referral to Social Work    3. Generalized anxiety disorder  F41.1 hydrOXYzine  (ATARAX ) 10 MG tablet    Ambulatory referral to Social Work      History of Present Illness: 39 year old male seen today for follow-up psychiatric evaluation.  He has a psychiatric history of bipolar 1 disorder, marijuana use, anxiety, and ADHD.  Patient currently receives care at [facility].  He is currently prescribed Wellbutrin 450 mg daily, Prozac 40 daily, Adderall 20 mg twice daily, and hydroxyzine  25 mg as needed.  He reports his medications are somewhat effective for managing his psychiatric conditions.  Today he is well-groomed, pleasant, cooperative, and engaged in conversation.  He informed clinical research associate that he would like medication to help manage his anxiety.  He notes that his wife is [redacted] weeks pregnant.  He reports that his unborn daughter will be coming soon and he is worried about finances.  He also notes that his car is broken and stressed out about his vehicle.  Patient informed clinical research associate that he also is currently unemployed.  He notes that he was fired from his job and patient is going through mediation with EEOC (equal employment opportunity commission).  Patient informed clinical research associate that he worked in scientist, research (physical sciences).  He informed clinical research associate that one of his teammates called him the N word.  He informed clinical research associate that when he brought this up to the superintendent they brushed it off and later fired him.  Patient is hopeful that mediation will be resolved soon.  Patient reports that he was traumatized on his job.  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  He notes that he has flashbacks, avoidant behaviors, and notes that he drinks about being buried alive.  Patient reports that the above exacerbates his anxiety and depression.  Today provider conducted a GAD-7 and patient scored an 18.  Provider also conducted PHQ-9 the patient scored a 10.  Patient notes that his sleep is poor.  He informed clinical research associate that he sleeps approximately 4 hours.  He notes that his appetite is adequate.  Today he denies SI/HI/VAH.  Patient presents with some delusional thinking.  He notes that at times he believes he is in the matrix.  He informed clinical research associate that he does not know if reality is real or if he is existing in a matrix.  At times he reports that he is distractible, has fluctuations in mood, and impulsive speeding.  To cope with stressors patient notes that he smokes marijuana.  He informed clinical research associate that he has been smoking marijuana daily.  Provider informed patient that marijuana can exacerbate his mood and affect his mental health.  He endorsed understanding.  Patient also notes that he smokes tobacco.  At this time he is not interested in Nicorette patch or gum.  Today patient agreeable to starting hydroxyzine  10 mg 3 times daily as needed to help with anxiety.  Patient informed that he can increase his nighttime hydroxyzine  to 50 mg if needed to help manage sleep.  He will continue his other medications as prescribed and follow-up with [facility] for future medication refills.  Patient will follow-up with writer as needed.  Patient referred to outpatient counseling for therapy.  No other concerns at this time.  Associated Signs/Symptoms: Depression Symptoms:  depressed mood, insomnia, fatigue, difficulty concentrating, anxiety, disturbed sleep, (Hypo) Manic Symptoms:  Delusions, Distractibility, Elevated Mood, Impulsivity, Anxiety Symptoms:  Excessive Worry, Psychotic Symptoms:  Paranoia, PTSD  Symptoms: Had a traumatic exposure:  Reports that he had trauma at his old job. He reports that trenches fell in on him. He also notes that ladders fell on him.  He reports having dreams about being buried alive Re-experiencing:  Flashbacks Intrusive Thoughts Nightmares Hypervigilance:  Yes Avoidance:  Decreased Interest/Participation  Past Psychiatric History: Bipolar 1, ADHD, anxiety  Previous Psychotropic Medications: Yes  Prozac,Trazodone, Paxil, Wellbutrin, hydroxyzine , Adderall  Substance Abuse History in the last 12 months:  Yes.    Consequences of Substance Abuse: NA  Past Medical History:  Past Medical History:  Diagnosis Date   Asthma    no inhaler use in > 1 yr.   History of seizure age 69   x 1 - result of a head injury - was never on anticonvulsant med.   Indigestion    occasional - no current med.   Laceration of finger of right hand with tendon involvement 06/20/2014   index and long fingers    Past Surgical History:  Procedure Laterality Date   NERVE, TENDON AND ARTERY REPAIR Right 06/27/2014   Procedure: REPAIR AND EXPLORE AS NEEDED NERVE, TENDON AND ARTERY OF THE RIGHT INDEX AND LONG FINGER;  Surgeon: Donnice Robinsons, MD;  Location: [facility];  Service: Orthopedics;  Laterality: Right;   NO PAST SURGERIES      Family Psychiatric History: Father schizophrenia, brother alcohol misuse   Family History:  Family History  Problem Relation Age of Onset   Diabetes Mother    Hypertension Mother    Diabetes Father    Hypertension Father     Social History:   Social History   Socioeconomic History   Marital status: Single    Spouse name: Not on file   Number of children: Not on file   Years of education: Not on file   Highest education level: Not on file  Occupational History   Not on file  Tobacco Use   Smoking status: Every Day    Current packs/day: 0.50    Average packs/day: 0.5 packs/day for 8.0 years (4.0 ttl pk-yrs)    Types:  Cigarettes, E-cigarettes   Smokeless tobacco: Never  Vaping Use   Vaping status: Never Used  Substance and Sexual Activity   Alcohol use: Yes    Comment: occasionally   Drug use: Yes    Types: Marijuana    Comment: 1 gram per day   Sexual activity: Not Currently    Partners: Female  Other Topics Concern   Not on file  Social History Narrative   Not on file   Social Drivers of Health   Tobacco Use: High Risk (10/18/2024)   Patient History    Smoking Tobacco Use: Every Day    Smokeless Tobacco Use: Never    Passive Exposure: Not on file  Financial Resource Strain: High Risk (09/01/2024)   Overall Financial Resource Strain (CARDIA)    Difficulty of Paying Living Expenses: Hard  Food Insecurity: No Food Insecurity (09/01/2024)   Epic    Worried About Radiation Protection Practitioner of Food in the Last Year: Never true    Ran Out of  Food in the Last Year: Never true  Transportation Needs: No Transportation Needs (09/01/2024)   Epic    Lack of Transportation (Medical): No    Lack of Transportation (Non-Medical): No  Physical Activity: Insufficiently Active (09/01/2024)   Exercise Vital Sign    Days of Exercise per Week: 2 days    Minutes of Exercise per Session: 40 min  Stress: Stress Concern Present (09/01/2024)   [employer] of Occupational Health - Occupational Stress Questionnaire    Feeling of Stress: Very much  Social Connections: Unknown (09/01/2024)   Social Connection and Isolation Panel    Frequency of Communication with Friends and Family: Once a week    Frequency of Social Gatherings with Friends and Family: Once a week    Attends Religious Services: Not on Insurance Claims Handler of Clubs or Organizations: No    Attends Banker Meetings: Never    Marital Status: Married  Depression (PHQ2-9): Medium Risk (10/18/2024)   Depression (PHQ2-9)    PHQ-2 Score: 10  Alcohol Screen: Low Risk (09/01/2024)   Alcohol Screen    Last Alcohol Screening Score (AUDIT): 1  Housing:  Not on file  Utilities: Not on file  Health Literacy: Not on file    Additional Social History: Patient resides in [place]. He is married and has a 28 year old son and a unborn daughter. He notes that he smokes marijuana daily. He also uses tobacco. He denies alcohol use.   Allergies:  Allergies[1]  Metabolic Disorder Labs: Lab Results  Component Value Date   HGBA1C 5.4 09/04/2018   No results found for: PROLACTIN No results found for: CHOL, TRIG, HDL, CHOLHDL, VLDL, LDLCALC Lab Results  Component Value Date   TSH 0.716 09/04/2018    Therapeutic Level Labs: No results found for: LITHIUM No results found for: CBMZ No results found for: VALPROATE  Current Medications: Current Outpatient Medications  Medication Sig Dispense Refill   hydrOXYzine  (ATARAX ) 10 MG tablet Take 1 tablet (10 mg total) by mouth 3 (three) times daily as needed. 90 tablet 3   albuterol  (PROVENTIL ) (2.5 MG/3ML) 0.083% nebulizer solution Inhale 3 mLs into the lungs every 4 (four) hours as needed for wheezing or shortness of breath. 75 mL 3   hydrocortisone  cream 1 % Apply to affected area 2 times daily 15 g 0   montelukast  (SINGULAIR ) 10 MG tablet Take 1 tablet (10 mg total) by mouth at bedtime. 30 tablet 3   No current facility-administered medications for this visit.    Musculoskeletal: Strength & Muscle Tone: within normal limits Gait & Station: normal Patient leans: N/A  Psychiatric Specialty Exam: Review of Systems  Blood pressure (!) 170/87, pulse 92, temperature 98.3 F (36.8 C), height 6' (1.829 m), weight 233 lb 3.2 oz (105.8 kg), SpO2 97%.Body mass index is 31.63 kg/m.  General Appearance: Well Groomed  Eye Contact:  Good  Speech:  Clear and Coherent and Normal Rate  Volume:  Normal  Mood:  Anxious and Depressed  Affect:  Appropriate and Congruent  Thought Process:  Coherent, Goal Directed, and Linear  Orientation:  Full (Time, Place, and Person)  Thought  Content:  WDL and Logical  Suicidal Thoughts:  No  Homicidal Thoughts:  No  Memory:  Immediate;   Good Recent;   Good Remote;   Good  Judgement:  Good  Insight:  Good  Psychomotor Activity:  Normal  Concentration:  Concentration: Good and Attention Span: Good  Recall:  Good  Fund  of Knowledge:Good  Language: Good  Akathisia:  No  Handed:  Right  AIMS (if indicated):  not done  Assets:  Communication Skills Desire for Improvement Financial Resources/Insurance Housing Intimacy Leisure Time Physical Health Social Support Transportation  ADL's:  Intact  Cognition: WNL  Sleep:  Fair   Screenings: GAD-7    Loss Adjuster, Chartered Office Visit from 10/18/2024 in [facility] Office Visit from 09/04/2018 in [facility]  Total GAD-7 Score 18 6   PHQ2-9    Flowsheet Row Office Visit from 10/18/2024 in [facility] Counselor from 09/01/2024 in [facility] Office Visit from 09/04/2018 in [facility]  PHQ-2 Total Score 2 4 2   PHQ-9 Total Score 10 18 5    Flowsheet Row Counselor from 09/01/2024 in [facility] ED from 02/10/2021 in [facility] at [facility] UC from 02/08/2021 in [facility] RISK CATEGORY Moderate Risk No Risk No Risk    Assessment and Plan: Patient endorses increased anxiety and fluctuations in sleep.  Patient received psychiatric medication management from [facility].  He plans to follow back up there.  Today patient given hydroxyzine  10 mg 3 times daily to help manage anxiety.  He will continue his other medications as prescribed.  Patient will follow-up as needed.  Patient referred to outpatient counseling for therapy.  1. Marijuana use (Primary)  - Ambulatory referral to Social Work  2. PTSD (post-traumatic stress disorder)  -  Ambulatory referral to Social Work  3. Generalized anxiety disorder  Start- hydrOXYzine  (ATARAX ) 10 MG tablet; Take 1 tablet (10 mg total) by mouth 3 (three) times daily as needed.  Dispense: 90 tablet; Refill: 3 - Ambulatory referral to Social Work   Collaboration of Care: Other provider involved in patient's care AEB PCP  Patient/Guardian was advised Release of Information must be obtained prior to any record release in order to collaborate their care with an outside provider. Patient/Guardian was advised if they have not already done so to contact the registration department to sign all necessary forms in order for us  to release information regarding their care.   Consent: Patient/Guardian gives verbal consent for treatment and assignment of benefits for services provided during this visit. Patient/Guardian expressed understanding and agreed to proceed.    Follow up as need Gilbert FORBES Bach, NP 1/19/202610:31 AM     [1]  Allergies Allergen Reactions   Penicillins Shortness Of Breath   "

## 2024-12-02 ENCOUNTER — Ambulatory Visit (HOSPITAL_COMMUNITY)

## 2024-12-28 ENCOUNTER — Ambulatory Visit (HOSPITAL_COMMUNITY): Admitting: Student in an Organized Health Care Education/Training Program
